# Patient Record
Sex: Male | Born: 1970 | Race: White | Hispanic: No | Marital: Single | Smoking: Never smoker
Health system: Southern US, Community
[De-identification: ages and names within clinical notes are randomized; demographics above are authoritative.]

## PROBLEM LIST (undated history)

## (undated) DIAGNOSIS — B20 Human immunodeficiency virus [HIV] disease: Secondary | ICD-10-CM

## (undated) DIAGNOSIS — N200 Calculus of kidney: Secondary | ICD-10-CM

## (undated) DIAGNOSIS — K429 Umbilical hernia without obstruction or gangrene: Secondary | ICD-10-CM

## (undated) DIAGNOSIS — Z87442 Personal history of urinary calculi: Secondary | ICD-10-CM

## (undated) DIAGNOSIS — R569 Unspecified convulsions: Secondary | ICD-10-CM

## (undated) DIAGNOSIS — K219 Gastro-esophageal reflux disease without esophagitis: Secondary | ICD-10-CM

## (undated) DIAGNOSIS — F5104 Psychophysiologic insomnia: Secondary | ICD-10-CM

## (undated) DIAGNOSIS — E8814 HIV-associated lipodystrophy: Secondary | ICD-10-CM

## (undated) DIAGNOSIS — K4020 Bilateral inguinal hernia, without obstruction or gangrene, not specified as recurrent: Secondary | ICD-10-CM

## (undated) DIAGNOSIS — E119 Type 2 diabetes mellitus without complications: Secondary | ICD-10-CM

## (undated) DIAGNOSIS — J302 Other seasonal allergic rhinitis: Secondary | ICD-10-CM

## (undated) DIAGNOSIS — T7840XA Allergy, unspecified, initial encounter: Secondary | ICD-10-CM

## (undated) HISTORY — DX: Allergy, unspecified, initial encounter: T78.40XA

## (undated) HISTORY — DX: Umbilical hernia without obstruction or gangrene: K42.9

## (undated) HISTORY — DX: Gastro-esophageal reflux disease without esophagitis: K21.9

## (undated) HISTORY — PX: WISDOM TOOTH EXTRACTION: SHX21

## (undated) HISTORY — DX: Bilateral inguinal hernia, without obstruction or gangrene, not specified as recurrent: K40.20

## (undated) HISTORY — PX: HERNIA REPAIR: SHX51

## (undated) HISTORY — DX: Human immunodeficiency virus (HIV) disease: B20

---
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
## 1996-12-17 HISTORY — PX: URETEROSCOPY: SHX842

## 1998-08-19 HISTORY — PX: LITHOTRIPSY: SUR834

## 2000-04-04 ENCOUNTER — Emergency Department (HOSPITAL_COMMUNITY): Admission: EM | Admit: 2000-04-04 | Discharge: 2000-04-04 | Payer: Self-pay | Admitting: Emergency Medicine

## 2000-08-19 DIAGNOSIS — B20 Human immunodeficiency virus [HIV] disease: Secondary | ICD-10-CM

## 2000-08-19 DIAGNOSIS — Z21 Asymptomatic human immunodeficiency virus [HIV] infection status: Secondary | ICD-10-CM

## 2000-08-19 HISTORY — DX: Asymptomatic human immunodeficiency virus (hiv) infection status: Z21

## 2000-08-19 HISTORY — DX: Human immunodeficiency virus (HIV) disease: B20

## 2000-10-02 ENCOUNTER — Encounter (INDEPENDENT_AMBULATORY_CARE_PROVIDER_SITE_OTHER): Payer: Self-pay | Admitting: *Deleted

## 2000-10-02 LAB — CONVERTED CEMR LAB: CD4 Count: 381 microliters

## 2000-11-13 ENCOUNTER — Encounter: Admission: RE | Admit: 2000-11-13 | Discharge: 2000-11-13 | Payer: Self-pay | Admitting: Infectious Diseases

## 2000-11-13 ENCOUNTER — Ambulatory Visit (HOSPITAL_COMMUNITY): Admission: RE | Admit: 2000-11-13 | Discharge: 2000-11-13 | Payer: Self-pay | Admitting: Infectious Diseases

## 2000-12-17 ENCOUNTER — Ambulatory Visit (HOSPITAL_COMMUNITY): Admission: RE | Admit: 2000-12-17 | Discharge: 2000-12-17 | Payer: Self-pay | Admitting: Infectious Diseases

## 2000-12-17 ENCOUNTER — Encounter: Admission: RE | Admit: 2000-12-17 | Discharge: 2000-12-17 | Payer: Self-pay | Admitting: Infectious Diseases

## 2000-12-17 ENCOUNTER — Encounter: Payer: Self-pay | Admitting: Infectious Diseases

## 2000-12-31 ENCOUNTER — Encounter: Admission: RE | Admit: 2000-12-31 | Discharge: 2000-12-31 | Payer: Self-pay | Admitting: Infectious Diseases

## 2001-01-27 ENCOUNTER — Encounter: Admission: RE | Admit: 2001-01-27 | Discharge: 2001-01-27 | Payer: Self-pay | Admitting: Infectious Diseases

## 2001-03-03 ENCOUNTER — Emergency Department (HOSPITAL_COMMUNITY): Admission: EM | Admit: 2001-03-03 | Discharge: 2001-03-03 | Payer: Self-pay

## 2001-03-05 ENCOUNTER — Ambulatory Visit (HOSPITAL_COMMUNITY): Admission: RE | Admit: 2001-03-05 | Discharge: 2001-03-05 | Payer: Self-pay | Admitting: Urology

## 2001-03-05 ENCOUNTER — Encounter: Payer: Self-pay | Admitting: Urology

## 2001-03-25 ENCOUNTER — Ambulatory Visit (HOSPITAL_COMMUNITY): Admission: RE | Admit: 2001-03-25 | Discharge: 2001-03-25 | Payer: Self-pay | Admitting: Infectious Diseases

## 2001-03-25 ENCOUNTER — Encounter: Admission: RE | Admit: 2001-03-25 | Discharge: 2001-03-25 | Payer: Self-pay | Admitting: Infectious Diseases

## 2001-07-22 ENCOUNTER — Encounter: Admission: RE | Admit: 2001-07-22 | Discharge: 2001-07-22 | Payer: Self-pay | Admitting: Infectious Diseases

## 2001-07-22 ENCOUNTER — Ambulatory Visit (HOSPITAL_COMMUNITY): Admission: RE | Admit: 2001-07-22 | Discharge: 2001-07-22 | Payer: Self-pay | Admitting: Infectious Diseases

## 2001-09-23 ENCOUNTER — Ambulatory Visit (HOSPITAL_COMMUNITY): Admission: RE | Admit: 2001-09-23 | Discharge: 2001-09-23 | Payer: Self-pay | Admitting: Infectious Diseases

## 2001-09-23 ENCOUNTER — Encounter: Admission: RE | Admit: 2001-09-23 | Discharge: 2001-09-23 | Payer: Self-pay | Admitting: Infectious Diseases

## 2002-01-06 ENCOUNTER — Encounter: Admission: RE | Admit: 2002-01-06 | Discharge: 2002-01-06 | Payer: Self-pay | Admitting: Infectious Diseases

## 2002-01-06 ENCOUNTER — Ambulatory Visit (HOSPITAL_COMMUNITY): Admission: RE | Admit: 2002-01-06 | Discharge: 2002-01-06 | Payer: Self-pay | Admitting: Infectious Diseases

## 2002-01-29 ENCOUNTER — Ambulatory Visit (HOSPITAL_COMMUNITY): Admission: RE | Admit: 2002-01-29 | Discharge: 2002-01-29 | Payer: Self-pay | Admitting: Infectious Diseases

## 2002-01-29 ENCOUNTER — Encounter: Admission: RE | Admit: 2002-01-29 | Discharge: 2002-01-29 | Payer: Self-pay | Admitting: Infectious Diseases

## 2002-02-01 ENCOUNTER — Ambulatory Visit (HOSPITAL_COMMUNITY): Admission: RE | Admit: 2002-02-01 | Discharge: 2002-02-01 | Payer: Self-pay | Admitting: Infectious Diseases

## 2002-02-01 ENCOUNTER — Encounter: Payer: Self-pay | Admitting: Infectious Diseases

## 2002-02-10 ENCOUNTER — Encounter: Admission: RE | Admit: 2002-02-10 | Discharge: 2002-02-10 | Payer: Self-pay | Admitting: Infectious Diseases

## 2002-02-10 ENCOUNTER — Ambulatory Visit (HOSPITAL_COMMUNITY): Admission: RE | Admit: 2002-02-10 | Discharge: 2002-02-10 | Payer: Self-pay | Admitting: Infectious Diseases

## 2002-03-24 ENCOUNTER — Encounter: Admission: RE | Admit: 2002-03-24 | Discharge: 2002-03-24 | Payer: Self-pay | Admitting: Infectious Diseases

## 2002-03-24 ENCOUNTER — Ambulatory Visit (HOSPITAL_COMMUNITY): Admission: RE | Admit: 2002-03-24 | Discharge: 2002-03-24 | Payer: Self-pay | Admitting: Infectious Diseases

## 2002-04-07 ENCOUNTER — Encounter: Admission: RE | Admit: 2002-04-07 | Discharge: 2002-04-07 | Payer: Self-pay | Admitting: Internal Medicine

## 2002-07-07 ENCOUNTER — Ambulatory Visit (HOSPITAL_COMMUNITY): Admission: RE | Admit: 2002-07-07 | Discharge: 2002-07-07 | Payer: Self-pay | Admitting: Infectious Diseases

## 2002-07-07 ENCOUNTER — Encounter: Admission: RE | Admit: 2002-07-07 | Discharge: 2002-07-07 | Payer: Self-pay | Admitting: Infectious Diseases

## 2002-07-30 ENCOUNTER — Encounter: Admission: RE | Admit: 2002-07-30 | Discharge: 2002-07-30 | Payer: Self-pay | Admitting: Infectious Diseases

## 2002-07-30 ENCOUNTER — Ambulatory Visit (HOSPITAL_COMMUNITY): Admission: RE | Admit: 2002-07-30 | Discharge: 2002-07-30 | Payer: Self-pay | Admitting: Infectious Diseases

## 2002-07-30 ENCOUNTER — Encounter: Payer: Self-pay | Admitting: Infectious Diseases

## 2002-08-30 ENCOUNTER — Ambulatory Visit (HOSPITAL_COMMUNITY): Admission: RE | Admit: 2002-08-30 | Discharge: 2002-08-30 | Payer: Self-pay | Admitting: Infectious Diseases

## 2002-08-30 ENCOUNTER — Encounter: Admission: RE | Admit: 2002-08-30 | Discharge: 2002-08-30 | Payer: Self-pay | Admitting: Infectious Diseases

## 2002-12-22 ENCOUNTER — Encounter: Admission: RE | Admit: 2002-12-22 | Discharge: 2002-12-22 | Payer: Self-pay | Admitting: Infectious Diseases

## 2002-12-22 ENCOUNTER — Encounter: Payer: Self-pay | Admitting: Infectious Diseases

## 2003-04-06 ENCOUNTER — Encounter: Admission: RE | Admit: 2003-04-06 | Discharge: 2003-04-06 | Payer: Self-pay | Admitting: Infectious Diseases

## 2003-04-06 ENCOUNTER — Encounter: Payer: Self-pay | Admitting: Infectious Diseases

## 2003-04-06 ENCOUNTER — Ambulatory Visit (HOSPITAL_COMMUNITY): Admission: RE | Admit: 2003-04-06 | Discharge: 2003-04-06 | Payer: Self-pay | Admitting: Infectious Diseases

## 2003-07-13 ENCOUNTER — Encounter: Admission: RE | Admit: 2003-07-13 | Discharge: 2003-07-13 | Payer: Self-pay | Admitting: Infectious Diseases

## 2003-07-13 ENCOUNTER — Ambulatory Visit (HOSPITAL_COMMUNITY): Admission: RE | Admit: 2003-07-13 | Discharge: 2003-07-13 | Payer: Self-pay | Admitting: Infectious Diseases

## 2003-07-13 ENCOUNTER — Encounter: Payer: Self-pay | Admitting: Infectious Diseases

## 2003-08-31 ENCOUNTER — Encounter: Admission: RE | Admit: 2003-08-31 | Discharge: 2003-08-31 | Payer: Self-pay | Admitting: Infectious Diseases

## 2003-09-14 ENCOUNTER — Encounter: Admission: RE | Admit: 2003-09-14 | Discharge: 2003-09-14 | Payer: Self-pay | Admitting: Infectious Diseases

## 2003-10-12 ENCOUNTER — Encounter: Admission: RE | Admit: 2003-10-12 | Discharge: 2003-10-12 | Payer: Self-pay | Admitting: Infectious Diseases

## 2004-01-11 ENCOUNTER — Ambulatory Visit (HOSPITAL_COMMUNITY): Admission: RE | Admit: 2004-01-11 | Discharge: 2004-01-11 | Payer: Self-pay | Admitting: Infectious Diseases

## 2004-01-11 ENCOUNTER — Encounter: Admission: RE | Admit: 2004-01-11 | Discharge: 2004-01-11 | Payer: Self-pay | Admitting: Infectious Diseases

## 2004-05-23 ENCOUNTER — Ambulatory Visit: Payer: Self-pay | Admitting: Infectious Diseases

## 2004-05-23 ENCOUNTER — Ambulatory Visit (HOSPITAL_COMMUNITY): Admission: RE | Admit: 2004-05-23 | Discharge: 2004-05-23 | Payer: Self-pay | Admitting: Infectious Diseases

## 2004-09-28 ENCOUNTER — Ambulatory Visit: Payer: Self-pay | Admitting: Infectious Diseases

## 2004-10-01 ENCOUNTER — Ambulatory Visit (HOSPITAL_COMMUNITY): Admission: RE | Admit: 2004-10-01 | Discharge: 2004-10-01 | Payer: Self-pay | Admitting: Infectious Diseases

## 2004-10-01 ENCOUNTER — Ambulatory Visit: Payer: Self-pay | Admitting: Infectious Diseases

## 2004-12-28 ENCOUNTER — Ambulatory Visit: Payer: Self-pay | Admitting: Infectious Diseases

## 2005-05-31 ENCOUNTER — Ambulatory Visit: Payer: Self-pay | Admitting: Infectious Diseases

## 2005-05-31 ENCOUNTER — Ambulatory Visit (HOSPITAL_COMMUNITY): Admission: RE | Admit: 2005-05-31 | Discharge: 2005-05-31 | Payer: Self-pay | Admitting: Infectious Diseases

## 2006-01-22 ENCOUNTER — Ambulatory Visit: Payer: Self-pay | Admitting: Infectious Diseases

## 2006-01-22 ENCOUNTER — Encounter (INDEPENDENT_AMBULATORY_CARE_PROVIDER_SITE_OTHER): Payer: Self-pay | Admitting: *Deleted

## 2006-01-22 ENCOUNTER — Encounter: Admission: RE | Admit: 2006-01-22 | Discharge: 2006-01-22 | Payer: Self-pay | Admitting: Infectious Diseases

## 2006-05-26 ENCOUNTER — Ambulatory Visit: Payer: Self-pay | Admitting: Infectious Diseases

## 2006-05-26 ENCOUNTER — Encounter: Admission: RE | Admit: 2006-05-26 | Discharge: 2006-05-26 | Payer: Self-pay | Admitting: Infectious Diseases

## 2006-05-26 ENCOUNTER — Encounter (INDEPENDENT_AMBULATORY_CARE_PROVIDER_SITE_OTHER): Payer: Self-pay | Admitting: *Deleted

## 2006-05-26 LAB — CONVERTED CEMR LAB: HIV 1 RNA Quant: 424 copies/mL

## 2006-05-29 DIAGNOSIS — B20 Human immunodeficiency virus [HIV] disease: Secondary | ICD-10-CM

## 2006-05-29 DIAGNOSIS — A630 Anogenital (venereal) warts: Secondary | ICD-10-CM | POA: Insufficient documentation

## 2006-05-29 DIAGNOSIS — J309 Allergic rhinitis, unspecified: Secondary | ICD-10-CM | POA: Insufficient documentation

## 2006-05-29 DIAGNOSIS — B029 Zoster without complications: Secondary | ICD-10-CM | POA: Insufficient documentation

## 2006-05-29 DIAGNOSIS — B259 Cytomegaloviral disease, unspecified: Secondary | ICD-10-CM

## 2006-05-29 DIAGNOSIS — E781 Pure hyperglyceridemia: Secondary | ICD-10-CM

## 2006-08-27 ENCOUNTER — Encounter: Admission: RE | Admit: 2006-08-27 | Discharge: 2006-08-27 | Payer: Self-pay | Admitting: Infectious Diseases

## 2006-08-27 ENCOUNTER — Ambulatory Visit: Payer: Self-pay | Admitting: Infectious Diseases

## 2006-08-27 ENCOUNTER — Encounter (INDEPENDENT_AMBULATORY_CARE_PROVIDER_SITE_OTHER): Payer: Self-pay | Admitting: *Deleted

## 2006-08-27 LAB — CONVERTED CEMR LAB
CD4 Count: 510 microliters
HIV 1 RNA Quant: 49 copies/mL

## 2006-08-28 ENCOUNTER — Encounter: Payer: Self-pay | Admitting: Infectious Diseases

## 2006-08-28 LAB — CONVERTED CEMR LAB
ALT: 115 units/L — ABNORMAL HIGH (ref 0–53)
AST: 73 units/L — ABNORMAL HIGH (ref 0–37)
Albumin: 4.5 g/dL (ref 3.5–5.2)
Alkaline Phosphatase: 80 units/L (ref 39–117)
BUN: 13 mg/dL (ref 6–23)
CO2: 25 meq/L (ref 19–32)
Calcium: 9.4 mg/dL (ref 8.4–10.5)
Chloride: 103 meq/L (ref 96–112)
Cholesterol: 187 mg/dL (ref 0–200)
Creatinine, Ser: 0.75 mg/dL (ref 0.40–1.50)
Glucose, Bld: 95 mg/dL (ref 70–99)
HCT: 42.6 % (ref 39.0–52.0)
HDL: 31 mg/dL — ABNORMAL LOW (ref >39)
Hemoglobin: 14.6 g/dL (ref 13.0–17.0)
LDL Cholesterol: 91 mg/dL (ref 0–99)
MCHC: 34.3 g/dL (ref 30.0–36.0)
MCV: 125.7 fL — ABNORMAL HIGH (ref 78.0–100.0)
Platelets: 344 10*3/uL (ref 150–400)
Potassium: 4.3 meq/L (ref 3.5–5.3)
RBC: 3.39 M/uL — ABNORMAL LOW (ref 4.22–5.81)
RDW: 13.7 % (ref 11.5–14.0)
Sodium: 140 meq/L (ref 135–145)
Total Bilirubin: 0.9 mg/dL (ref 0.3–1.2)
Total CHOL/HDL Ratio: 6
Total Protein: 7.5 g/dL (ref 6.0–8.3)
Triglycerides: 326 mg/dL — ABNORMAL HIGH (ref <150)
VLDL: 65 mg/dL — ABNORMAL HIGH (ref 0–40)
WBC: 4.7 10*3/uL (ref 4.0–10.5)

## 2006-10-13 ENCOUNTER — Encounter (INDEPENDENT_AMBULATORY_CARE_PROVIDER_SITE_OTHER): Payer: Self-pay | Admitting: *Deleted

## 2006-10-13 LAB — CONVERTED CEMR LAB

## 2006-10-26 ENCOUNTER — Encounter (INDEPENDENT_AMBULATORY_CARE_PROVIDER_SITE_OTHER): Payer: Self-pay | Admitting: *Deleted

## 2006-11-04 ENCOUNTER — Encounter: Payer: Self-pay | Admitting: Infectious Diseases

## 2006-12-08 ENCOUNTER — Telehealth: Payer: Self-pay | Admitting: Infectious Diseases

## 2007-01-19 ENCOUNTER — Telehealth: Payer: Self-pay | Admitting: Infectious Diseases

## 2007-01-30 ENCOUNTER — Telehealth: Payer: Self-pay | Admitting: Infectious Diseases

## 2007-02-25 ENCOUNTER — Encounter: Admission: RE | Admit: 2007-02-25 | Discharge: 2007-02-25 | Payer: Self-pay | Admitting: Infectious Diseases

## 2007-02-25 ENCOUNTER — Ambulatory Visit: Payer: Self-pay | Admitting: Infectious Diseases

## 2007-02-25 LAB — CONVERTED CEMR LAB
Alkaline Phosphatase: 90 units/L (ref 39–117)
BUN: 14 mg/dL (ref 6–23)
Creatinine, Ser: 0.73 mg/dL (ref 0.40–1.50)
Glucose, Bld: 87 mg/dL (ref 70–99)
HDL: 36 mg/dL — ABNORMAL LOW (ref >39)
Hemoglobin: 15.4 g/dL (ref 13.0–17.0)
LDL Cholesterol: 91 mg/dL (ref 0–99)
MCHC: 33.8 g/dL (ref 30.0–36.0)
MCV: 124 fL — ABNORMAL HIGH (ref 78.0–100.0)
RBC: 3.67 M/uL — ABNORMAL LOW (ref 4.22–5.81)
Sodium: 140 meq/L (ref 135–145)
Total Bilirubin: 0.8 mg/dL (ref 0.3–1.2)
Total CHOL/HDL Ratio: 4.6
Triglycerides: 197 mg/dL — ABNORMAL HIGH (ref <150)
VLDL: 39 mg/dL (ref 0–40)

## 2007-02-26 ENCOUNTER — Encounter: Payer: Self-pay | Admitting: Infectious Diseases

## 2007-05-22 ENCOUNTER — Telehealth: Payer: Self-pay | Admitting: Infectious Diseases

## 2007-06-24 ENCOUNTER — Ambulatory Visit: Payer: Self-pay | Admitting: Infectious Diseases

## 2007-06-24 ENCOUNTER — Encounter: Admission: RE | Admit: 2007-06-24 | Discharge: 2007-06-24 | Payer: Self-pay | Admitting: Infectious Diseases

## 2007-06-24 LAB — CONVERTED CEMR LAB
Albumin: 4.5 g/dL (ref 3.5–5.2)
Alkaline Phosphatase: 102 units/L (ref 39–117)
BUN: 13 mg/dL (ref 6–23)
CO2: 27 meq/L (ref 19–32)
Glucose, Bld: 80 mg/dL (ref 70–99)
HIV 1 RNA Quant: 237 copies/mL — ABNORMAL HIGH (ref <50)
HIV-1 RNA Quant, Log: 2.37 — ABNORMAL HIGH (ref <1.70)
Hemoglobin: 14.3 g/dL (ref 13.0–17.0)
MCHC: 34.1 g/dL (ref 30.0–36.0)
MCV: 124 fL — ABNORMAL HIGH (ref 78.0–100.0)
RBC: 3.38 M/uL — ABNORMAL LOW (ref 4.22–5.81)
Total Bilirubin: 0.6 mg/dL (ref 0.3–1.2)

## 2007-07-03 ENCOUNTER — Ambulatory Visit: Payer: Self-pay | Admitting: Infectious Diseases

## 2007-07-03 DIAGNOSIS — R21 Rash and other nonspecific skin eruption: Secondary | ICD-10-CM | POA: Insufficient documentation

## 2007-07-03 DIAGNOSIS — N200 Calculus of kidney: Secondary | ICD-10-CM | POA: Insufficient documentation

## 2007-07-04 ENCOUNTER — Encounter: Payer: Self-pay | Admitting: Infectious Diseases

## 2007-07-14 ENCOUNTER — Ambulatory Visit: Payer: Self-pay | Admitting: Infectious Diseases

## 2007-11-25 ENCOUNTER — Ambulatory Visit: Payer: Self-pay | Admitting: Infectious Diseases

## 2007-11-25 DIAGNOSIS — H35719 Central serous chorioretinopathy, unspecified eye: Secondary | ICD-10-CM

## 2007-12-17 ENCOUNTER — Encounter: Admission: RE | Admit: 2007-12-17 | Discharge: 2007-12-17 | Payer: Self-pay | Admitting: Infectious Diseases

## 2007-12-17 ENCOUNTER — Ambulatory Visit: Payer: Self-pay | Admitting: Infectious Diseases

## 2007-12-17 LAB — CONVERTED CEMR LAB
ALT: 22 units/L (ref 0–53)
AST: 20 units/L (ref 0–37)
Albumin: 4.3 g/dL (ref 3.5–5.2)
Alkaline Phosphatase: 70 units/L (ref 39–117)
Glucose, Bld: 87 mg/dL (ref 70–99)
HDL: 35 mg/dL — ABNORMAL LOW (ref >39)
MCHC: 34.7 g/dL (ref 30.0–36.0)
Platelets: 298 10*3/uL (ref 150–400)
Potassium: 4.4 meq/L (ref 3.5–5.3)
RBC: 3.2 M/uL — ABNORMAL LOW (ref 4.22–5.81)
Sodium: 143 meq/L (ref 135–145)
Total CHOL/HDL Ratio: 3.4
Total Protein: 6.6 g/dL (ref 6.0–8.3)
Triglycerides: 98 mg/dL (ref <150)

## 2007-12-28 ENCOUNTER — Telehealth: Payer: Self-pay | Admitting: Infectious Diseases

## 2008-04-27 ENCOUNTER — Ambulatory Visit: Payer: Self-pay | Admitting: Infectious Diseases

## 2008-04-27 LAB — CONVERTED CEMR LAB
HDL: 36 mg/dL — ABNORMAL LOW (ref >39)
LDL Cholesterol: 76 mg/dL (ref 0–99)
Total CHOL/HDL Ratio: 4.2
Triglycerides: 194 mg/dL — ABNORMAL HIGH (ref <150)
VLDL: 39 mg/dL (ref 0–40)

## 2008-06-27 ENCOUNTER — Ambulatory Visit: Payer: Self-pay | Admitting: *Deleted

## 2008-09-26 ENCOUNTER — Ambulatory Visit: Payer: Self-pay | Admitting: Infectious Diseases

## 2008-09-26 LAB — CONVERTED CEMR LAB
AST: 25 units/L (ref 0–37)
Albumin: 4.8 g/dL (ref 3.5–5.2)
Alkaline Phosphatase: 66 units/L (ref 39–117)
BUN: 14 mg/dL (ref 6–23)
Basophils Relative: 1 % (ref 0–1)
Eosinophils Absolute: 0.1 10*3/uL (ref 0.0–0.7)
HIV-1 RNA Quant, Log: 2.71 — ABNORMAL HIGH (ref <1.68)
MCHC: 33.4 g/dL (ref 30.0–36.0)
MCV: 120.2 fL — ABNORMAL HIGH (ref 78.0–100.0)
Monocytes Relative: 9 % (ref 3–12)
Neutrophils Relative %: 53 % (ref 43–77)
Platelets: 222 10*3/uL (ref 150–400)
Potassium: 4.3 meq/L (ref 3.5–5.3)
RDW: 13.7 % (ref 11.5–15.5)
Sodium: 142 meq/L (ref 135–145)

## 2009-01-24 ENCOUNTER — Telehealth: Payer: Self-pay | Admitting: Infectious Diseases

## 2009-02-22 ENCOUNTER — Ambulatory Visit: Payer: Self-pay | Admitting: Infectious Diseases

## 2009-02-22 LAB — CONVERTED CEMR LAB
ALT: 24 units/L (ref 0–53)
Albumin: 4.6 g/dL (ref 3.5–5.2)
CO2: 26 meq/L (ref 19–32)
Calcium: 9.6 mg/dL (ref 8.4–10.5)
Chloride: 106 meq/L (ref 96–112)
Creatinine, Ser: 0.68 mg/dL (ref 0.40–1.50)
HDL: 34 mg/dL — ABNORMAL LOW (ref >39)
Lymphocytes Relative: 28 % (ref 12–46)
Lymphs Abs: 2.1 10*3/uL (ref 0.7–4.0)
Neutro Abs: 4.1 10*3/uL (ref 1.7–7.7)
Neutrophils Relative %: 56 % (ref 43–77)
Platelets: 255 10*3/uL (ref 150–400)
Total Protein: 7.5 g/dL (ref 6.0–8.3)
Triglycerides: 262 mg/dL — ABNORMAL HIGH (ref <150)
WBC: 7.3 10*3/uL (ref 4.0–10.5)

## 2009-06-22 ENCOUNTER — Encounter: Payer: Self-pay | Admitting: Infectious Diseases

## 2009-06-22 DIAGNOSIS — A6000 Herpesviral infection of urogenital system, unspecified: Secondary | ICD-10-CM | POA: Insufficient documentation

## 2009-06-23 ENCOUNTER — Encounter: Payer: Self-pay | Admitting: Infectious Diseases

## 2009-07-05 ENCOUNTER — Ambulatory Visit: Payer: Self-pay | Admitting: Infectious Diseases

## 2009-07-05 LAB — CONVERTED CEMR LAB
ALT: 30 units/L (ref 0–53)
AST: 30 units/L (ref 0–37)
Albumin: 4.5 g/dL (ref 3.5–5.2)
CO2: 26 meq/L (ref 19–32)
Calcium: 9 mg/dL (ref 8.4–10.5)
Chloride: 105 meq/L (ref 96–112)
Creatinine, Ser: 0.72 mg/dL (ref 0.40–1.50)
Eosinophils Absolute: 0.1 10*3/uL (ref 0.0–0.7)
Eosinophils Relative: 1 % (ref 0–5)
HCT: 34.4 % — ABNORMAL LOW (ref 39.0–52.0)
HIV 1 RNA Quant: <48 copies/mL (ref <48)
Herpes Simplex Vrs I&II-IgM Ab (EIA): 4.2 — ABNORMAL HIGH
Lymphocytes Relative: 30 % (ref 12–46)
Lymphs Abs: 2.1 10*3/uL (ref 0.7–4.0)
MCV: 123.7 fL — ABNORMAL HIGH (ref >=78.0)
Monocytes Relative: 10 % (ref 3–12)
Neutrophils Relative %: 60 % (ref 43–77)
Potassium: 4 meq/L (ref 3.5–5.3)
RBC: 2.78 M/uL — ABNORMAL LOW (ref 4.22–5.81)
WBC: 7.2 10*3/uL (ref 4.0–10.5)

## 2009-08-23 ENCOUNTER — Telehealth: Payer: Self-pay | Admitting: Infectious Diseases

## 2009-08-28 ENCOUNTER — Telehealth: Payer: Self-pay | Admitting: Infectious Diseases

## 2009-09-20 ENCOUNTER — Encounter (INDEPENDENT_AMBULATORY_CARE_PROVIDER_SITE_OTHER): Payer: Self-pay | Admitting: *Deleted

## 2009-10-24 ENCOUNTER — Ambulatory Visit: Payer: Self-pay | Admitting: Infectious Diseases

## 2009-10-24 LAB — CONVERTED CEMR LAB
Alkaline Phosphatase: 79 units/L (ref 39–117)
BUN: 19 mg/dL (ref 6–23)
Basophils Absolute: 0 10*3/uL (ref 0.0–0.1)
Basophils Relative: 1 % (ref 0–1)
CO2: 26 meq/L (ref 19–32)
Cholesterol: 180 mg/dL (ref 0–200)
Eosinophils Absolute: 0.2 10*3/uL (ref 0.0–0.7)
Eosinophils Relative: 4 % (ref 0–5)
Glucose, Bld: 100 mg/dL — ABNORMAL HIGH (ref 70–99)
HCT: 35.6 % — ABNORMAL LOW (ref 39.0–52.0)
HDL: 34 mg/dL — ABNORMAL LOW (ref >39)
HIV-1 RNA Quant, Log: <1.68 (ref <1.68)
Hemoglobin: 12.2 g/dL — ABNORMAL LOW (ref 13.0–17.0)
MCHC: 34.3 g/dL (ref 30.0–36.0)
MCV: 127.6 fL — ABNORMAL HIGH (ref >=78.0)
Monocytes Absolute: 0.5 10*3/uL (ref 0.1–1.0)
Monocytes Relative: 12 % (ref 3–12)
RBC: 2.79 M/uL — ABNORMAL LOW (ref 4.22–5.81)
RDW: 13.8 % (ref 11.5–15.5)
Sodium: 142 meq/L (ref 135–145)
Total Bilirubin: 0.5 mg/dL (ref 0.3–1.2)
Total Protein: 7.2 g/dL (ref 6.0–8.3)
Triglycerides: 560 mg/dL — ABNORMAL HIGH (ref <150)

## 2010-05-30 ENCOUNTER — Ambulatory Visit: Payer: Self-pay | Admitting: Infectious Diseases

## 2010-05-30 LAB — CONVERTED CEMR LAB
Alkaline Phosphatase: 73 units/L (ref 39–117)
CO2: 24 meq/L (ref 19–32)
Creatinine, Ser: 0.76 mg/dL (ref 0.40–1.50)
Eosinophils Absolute: 0.1 10*3/uL (ref 0.0–0.7)
Eosinophils Relative: 2 % (ref 0–5)
Glucose, Bld: 88 mg/dL (ref 70–99)
HCT: 33.8 % — ABNORMAL LOW (ref 39.0–52.0)
HIV 1 RNA Quant: <20 copies/mL (ref <20)
HIV-1 RNA Quant, Log: <1.3 (ref <1.30)
Hemoglobin: 12.4 g/dL — ABNORMAL LOW (ref 13.0–17.0)
Lymphocytes Relative: 22 % (ref 12–46)
Lymphs Abs: 1.7 10*3/uL (ref 0.7–4.0)
MCV: 123.8 fL — ABNORMAL HIGH (ref 78.0–100.0)
Monocytes Absolute: 0.7 10*3/uL (ref 0.1–1.0)
Platelets: 247 10*3/uL (ref 150–400)
RDW: 12.9 % (ref 11.5–15.5)
Total Bilirubin: 0.8 mg/dL (ref 0.3–1.2)
WBC: 7.6 10*3/uL (ref 4.0–10.5)

## 2010-07-03 ENCOUNTER — Telehealth: Payer: Self-pay | Admitting: Infectious Diseases

## 2010-08-01 ENCOUNTER — Encounter: Payer: Self-pay | Admitting: Infectious Diseases

## 2010-08-01 ENCOUNTER — Encounter (INDEPENDENT_AMBULATORY_CARE_PROVIDER_SITE_OTHER): Payer: Self-pay | Admitting: *Deleted

## 2010-09-16 LAB — CONVERTED CEMR LAB
HIV 1 RNA Quant: <50 copies/mL (ref <50)
HIV-1 RNA Quant, Log: <1.7 (ref <1.70)

## 2010-09-18 NOTE — Assessment & Plan Note (Signed)
Summary: f/u per pt. will do on same day [mkj]   CC:  follow-up visit.  History of Present Illness: 40 yo M with HIV+ since 2002. Maintained on CBV/EFV. CD4 700 VL <48  (11-13-09). Preparing for vacation.   Preventive Screening-Counseling & Management  Alcohol-Tobacco     Alcohol drinks/day: 2 drinks a week     Alcohol type: mixed drinks     Smoking Status: never     Passive Smoke Exposure: no  Caffeine-Diet-Exercise     Caffeine use/day: coffee     Does Patient Exercise: yes     Type of exercise: active at work     Exercise (avg: min/session): >60     Times/week: 5  Safety-Violence-Falls     Seat Belt Use: yes   Updated Prior Medication List: COMBIVIR 150-300 MG TABS (LAMIVUDINE-ZIDOVUDINE) one twice a day SUSTIVA 600 MG TABS (EFAVIRENZ) one at HS OMEPRAZOLE 20 MG  TBEC (OMEPRAZOLE) Take 1 tablet by mouth at bedtime, three times weekly PEPCID 40 MG  TABS (FAMOTIDINE) every morning ZYRTEC ALLERGY 10 MG  TABS (CETIRIZINE HCL) Take 1 tablet by mouth at bedtime MOBIC 15 MG TABS (MELOXICAM) take 1/2 to 1 tablet by mouth daily as needed BABY ASPIRIN 81 MG CHEW (ASPIRIN) Take 1 tablet by mouth once a day with food  Current Allergies (reviewed today): ! AMPICILLIN Past History:  Past medical, surgical, family and social histories (including risk factors) reviewed, and no changes noted (except as noted below).  Past Medical History: Reviewed history from 10/24/2009 and no changes required. HIV disease Liposdystrophy  Family History: Reviewed history from 06/24/2007 and no changes required. CAD in fathers family (60-70s).   Social History: Reviewed history from 05/29/2006 and no changes required. Single Never Smoked Drug use-no  Vital Signs:  Patient profile:   40 year old male Height:      72 inches (182.88 cm) Weight:      159.8 pounds (72.64 kg) BMI:     21.75 Temp:     98.5 degrees F (36.94 degrees C) oral Pulse rate:   106 / minute BP sitting:   127 / 83   (right arm)  Vitals Entered By: Baxter Hire) (May 30, 2010 9:10 AM) CC: follow-up visit Pain Assessment Patient in pain? no      Nutritional Status BMI of 19 -24 = normal Nutritional Status Detail appetite is good per patient  Have you ever been in a relationship where you felt threatened, hurt or afraid?No   Does patient need assistance? Functional Status Self care Ambulation Normal   Physical Exam  General:  well-developed, well-nourished, and well-hydrated.   Eyes:  pupils equal, pupils round, and pupils reactive to light.   Mouth:  pharynx pink and moist and no exudates.   Neck:  no masses.   Lungs:  normal respiratory effort and normal breath sounds.   Heart:  normal rate, regular rhythm, and no murmur.   Abdomen:  soft, non-tender, and normal bowel sounds.          Medication Adherence: 05/30/2010   Adherence to medications reviewed with patient. Counseling to provide adequate adherence provided   Prevention For Positives: 05/30/2010   Safe sex practices discussed with patient. Condoms offered.                             Impression & Recommendations:  Problem # 1:  HIV DISEASE (ICD-042) doing well, will recheck his labs. update  his vax (got Tet last month), got flu shot today. offered condoms. return to clinic 6 months.   Medications Added to Medication List This Visit: 1)  Omeprazole 20 Mg Tbec (Omeprazole) .... Take 1 tablet by mouth at bedtime, three times weekly  Other Orders: Influenza Vaccine NON MCR [id number]) T-CD4SP [facility]) (CD4SP) T-HIV Viral Load [phone]) T-Comprehensive Metabolic Panel [phone]) T-CBC w/Diff ([id number]) Est. Patient Level III ([address])    Influenza Vaccine    Vaccine Type: Fluvax Non-MCR    Site: right deltoid    Mfr: Novartis    Dose: 0.5 ml    Route: IM    Given by: Kathi Simpers CMA(AAMA)    Exp. Date: 11/18/2010    Lot #: [id number]    VIS given: 03/13/10 version given May 30, 2010.  Flu  Vaccine Consent Questions    Do you have a history of severe allergic reactions to this vaccine? no    Any prior history of allergic reactions to egg and/or gelatin? no    Do you have a sensitivity to the preservative Thimersol? no    Do you have a past history of Guillan-Barre Syndrome? no    Do you currently have an acute febrile illness? no    Have you ever had a severe reaction to latex? no    Vaccine information given and explained to patient? yes

## 2010-09-18 NOTE — Progress Notes (Signed)
  Phone Note Call from Patient   Reason for Call: Acute Illness, Talk to Doctor Summary of Call: pt believes he has sinus infection, would like z-pack and his flonase refilled.    New/Updated Medications: ZITHROMAX Z-PAK 250 MG TABS (AZITHROMYCIN) 2 tabs on day 1, then 1 tab each day on days 2-5 Prescriptions: ZITHROMAX Z-PAK 250 MG TABS (AZITHROMYCIN) 2 tabs on day 1, then 1 tab each day on days 2-5  #1 x 1   Entered and Authorized by:   Johny Sax MD   Signed by:   Johny Sax MD on 07/03/2010   Method used:   [facility]        [facility] [address] [phone]* (retail)       [address]       [place], [place]  [address]       Ph: [phone] or [phone]       Fax: [phone]   RxID:   [id number] FLUTICASONE PROPIONATE 50 MCG/ACT SUSP (FLUTICASONE PROPIONATE) Instill two sprays  nasally two times a day as needed for nasal symptoms  #16 [place] x 5   Entered and Authorized by:   Johny Sax MD   Signed by:   Johny Sax MD on 07/03/2010   Method used:   [facility]        [facility] [address] [phone]* (retail)       [address]       [place], [place]  [address]       Ph: [phone] or [phone]       Fax: [phone]   RxID:   [id number]

## 2010-09-18 NOTE — Miscellaneous (Signed)
Summary: RW Update  Clinical Lists Changes  Observations: Added new observation of HIV STATUS: CDC-defined AIDS (09/20/2009 16:30)

## 2010-09-18 NOTE — Assessment & Plan Note (Signed)
Summary: Brendan Price   CC:  follow-up visit.  History of Present Illness: 40 yo M with HIV+ since 2002. Maintained on CBV/EFV. CD4 700 VL <48  (11-10).  worried about FHx CAD, wants to start baby ASA.  otherwise doing well. no further rectal lesions.   Preventive Screening-Counseling & Management  Alcohol-Tobacco     Alcohol drinks/day: 2 drinks a week     Alcohol type: mixed drinks     Smoking Status: never     Passive Smoke Exposure: no  Caffeine-Diet-Exercise     Caffeine use/day: coffee     Does Patient Exercise: yes     Type of exercise: active at work     Exercise (avg: min/session): >60     Times/week: 5  Hep-HIV-STD-Contraception     HIV Risk: risk noted     HIV Risk Counseling: to avoid increased HIV risk  Safety-Violence-Falls     Seat Belt Use: yes  Comments: pt declined condoms  Current Medications (verified): 1)  Combivir 150-300 Mg Tabs (Lamivudine-Zidovudine) .... One Twice A Day 2)  Sustiva 600 Mg Tabs (Efavirenz) .... One At [facility] 3)  Omeprazole 20 Mg  Tbec (Omeprazole) .... Take 1 Tablet By Mouth At Bedtime 4)  Pepcid 40 Mg  Tabs (Famotidine) .... Every Morning 5)  Zyrtec Allergy 10 Mg  Tabs (Cetirizine Hcl) .... Take 1 Tablet By Mouth At Bedtime 6)  Mobic 15 Mg Tabs (Meloxicam) .... Take 1/2 To 1 Tablet By Mouth Daily As Needed  Allergies: 1)  ! Ampicillin    Updated Prior Medication List: COMBIVIR 150-300 MG TABS (LAMIVUDINE-ZIDOVUDINE) one twice a day SUSTIVA 600 MG TABS (EFAVIRENZ) one at HS OMEPRAZOLE 20 MG  TBEC (OMEPRAZOLE) Take 1 tablet by mouth at bedtime PEPCID 40 MG  TABS (FAMOTIDINE) every morning ZYRTEC ALLERGY 10 MG  TABS (CETIRIZINE HCL) Take 1 tablet by mouth at bedtime MOBIC 15 MG TABS (MELOXICAM) take 1/2 to 1 tablet by mouth daily as needed  Current Allergies (reviewed today): ! AMPICILLIN Past History:  Past Medical History: HIV disease Liposdystrophy  Review of Systems       wt up 7#  Vital Signs:  Patient profile:    40 year old male Height:      72 inches (182.88 cm) Weight:      158.7 pounds (68.86 kg) BMI:     21.60 Temp:     98.0 degrees F (36.67 degrees C) oral Pulse rate:   105 / minute BP sitting:   126 / 74  (left arm)  Vitals Entered By: Wendall Mola CMA Duncan Dull) (October 24, 2009 9:23 AM) CC: follow-up visit Is Patient Diabetic? No Pain Assessment Patient in pain? no      Nutritional Status BMI of 19 -24 = normal Nutritional Status Detail appetite "good"  Does patient need assistance? Functional Status Self care Ambulation Normal Comments no missed doses of meds per patient        Medication Adherence: 10/24/2009   Adherence to medications reviewed with patient. Counseling to provide adequate adherence provided   Prevention For Positives: 10/24/2009   Safe sex practices discussed with patient. Condoms offered.                             Physical Exam  General:  well-developed, well-nourished, and well-hydrated.   Eyes:  pupils equal, pupils round, and pupils reactive to light.   Mouth:  pharynx pink and moist and no  exudates.   Neck:  no masses.   Lungs:  normal respiratory effort and normal breath sounds.   Heart:  normal rate, regular rhythm, and no murmur.   Abdomen:  soft, non-tender, and normal bowel sounds.     Impression & Recommendations:  Problem # 1:  HIV DISEASE (ICD-042) doimg well. no change in meds. offered condoms. return to clinic 6 months  Problem # 2:  GENITAL HERPES (ICD-054.10) no further lesions, will cont to watch.   Other Orders: Est. Patient Level IV [id number]) T-CD4SP ([facility]) (CD4SP) T-HIV Viral Load [phone]) T-Comprehensive Metabolic Panel [phone]) T-Lipid Profile [phone]) T-CBC w/Diff [phone]) T-RPR (Syphilis) [phone])  Process Orders Check Orders Results:     [facility]: ABN not required for this insurance Tests Sent for requisitioning (October 24, 2009 9:56 AM):      10/24/2009: [facility] -- T-HIV Viral Load [phone] (signed)     10/24/2009: [facility] -- T-Comprehensive Metabolic Panel [[id number]] (signed)     10/24/2009: [facility] -- T-Lipid Profile [phone] (signed)     10/24/2009: [facility] -- T-CBC w/Diff [[id number]] (signed)     10/24/2009: [facility] -- T-RPR (Syphilis) [phone] (signed)    Immunization History:  Influenza Immunization History:    Influenza:  historical (06/19/2009)

## 2010-09-18 NOTE — Progress Notes (Signed)
Summary: Request to restart Meloxicam  Phone Note From Pharmacy   Caller: fax from [facility] Reason for Call: Needs renewal Summary of Call: Received a fax refill request for patient's Meloxicam 15mg   1/2 tablet as needed #30  apparently filled by Lavada Mesi MD at one time.  Medication was stopped by Ninetta Lights on 04/27/08. Patient is asking to restart this medication.  Do you want to restart or does patient need to be seen by you or another physician?  Follow-up for Phone Call        ok to restart thanks     Appended Document: Request to restart Meloxicam    Clinical Lists Changes  Medications: Added new medication of MELOXICAM 15 MG TABS (MELOXICAM) Take 1/2 tablet by mouth daily as needed - Signed Rx of MELOXICAM 15 MG TABS (MELOXICAM) Take 1/2 tablet by mouth daily as needed;  #30 x 5;  Signed;  Entered by: Paulo Fruit  BS,CPht II,MPH;  Authorized by: Johny Sax MD;  Method used: [facility]phoned [facility] [phone]*, [address] [place], [place]  [address], Ph: [phone] or [phone], Fax: [phone]    Prescriptions: MELOXICAM 15 MG TABS (MELOXICAM) Take 1/2 tablet by mouth daily as needed  #30 x 5   Entered by:   Paulo Fruit  BS,CPht II,MPH   Authorized by:   Johny Sax MD   Signed by:   Paulo Fruit  BS,CPht II,MPH on 08/23/2009   Method used:   Telephoned to ...       [facility] [address] [phone]* (retail)       [address]       [place], [place]  [address]       Ph: [phone] or [phone]       Fax: [phone]   RxID:   [id number]  Paulo Fruit  BS,CPht II,MPH  August 23, 2009 3:06 PM

## 2010-09-18 NOTE — Progress Notes (Signed)
Summary: Request change in directions per patient  Phone Note From Pharmacy   [facility] Summary of Call: Received a phone call back from [place] at [facility].  He said that the patient stated that his Meloxicam 15mg  1/ 2 to 1 tablet daily.  Do you want to change directions and if so, they are requesting 90-day supply per insurance.  Please let me know. Initial call taken by: Paulo Fruit  BS,CPht II,MPH,  August 28, 2009 8:53 AM  Follow-up for Phone Call        According to the previous phone note dated 08-23-08 this would be correct.  It is ok to refill as requested by patient.  Follow-up by: Tomasita Morrow RN,  August 28, 2009 9:29 AM  Additional Follow-up for Phone Call Additional follow up Details #1::        Medication directions changed to take one -half to one daily as needed with # 90 and 3 RF. Called to [facility] today by Tomasita Morrow RN  August 30, 2009 11:50 AM  Additional Follow-up by: Tomasita Morrow RN,  August 30, 2009 11:50 AM    New/Updated Medications: MOBIC 15 MG TABS (MELOXICAM) take 1/2 to 1 tablet by mouth daily as needed Prescriptions: MOBIC 15 MG TABS (MELOXICAM) take 1/2 to 1 tablet by mouth daily as needed  #90 x 3   Entered by:   Tomasita Morrow RN   Authorized by:   Johny Sax MD   Signed by:   Tomasita Morrow RN on 08/30/2009   Method used:   Telephoned to ...       [facility] [address] [phone]* (retail)       [address]       [place], [place]  [address]       Ph: [phone] or [phone]       Fax: [phone]   RxID:   [id number]

## 2010-09-20 NOTE — Miscellaneous (Signed)
  Clinical Lists Changes  Observations: Added new observation of YEARAIDSPOS: 2006  (08/01/2010 11:23)

## 2010-10-31 LAB — T-HELPER CELL (CD4) - (RCID CLINIC ONLY)
CD4 % Helper T Cell: 36 % (ref 33–55)
CD4 T Cell Abs: 570 uL (ref 400–2700)

## 2010-11-09 LAB — T-HELPER CELL (CD4) - (RCID CLINIC ONLY)
CD4 % Helper T Cell: 39 % (ref 33–55)
CD4 T Cell Abs: 700 uL (ref 400–2700)

## 2010-12-03 ENCOUNTER — Ambulatory Visit: Payer: Self-pay | Admitting: Infectious Diseases

## 2010-12-05 ENCOUNTER — Other Ambulatory Visit: Payer: Self-pay

## 2010-12-07 ENCOUNTER — Ambulatory Visit (INDEPENDENT_AMBULATORY_CARE_PROVIDER_SITE_OTHER): Payer: BC Managed Care – PPO | Admitting: Infectious Diseases

## 2010-12-07 ENCOUNTER — Encounter: Payer: Self-pay | Admitting: Infectious Diseases

## 2010-12-07 DIAGNOSIS — J309 Allergic rhinitis, unspecified: Secondary | ICD-10-CM | POA: Insufficient documentation

## 2010-12-07 DIAGNOSIS — F419 Anxiety disorder, unspecified: Secondary | ICD-10-CM

## 2010-12-07 DIAGNOSIS — Z23 Encounter for immunization: Secondary | ICD-10-CM

## 2010-12-07 DIAGNOSIS — F411 Generalized anxiety disorder: Secondary | ICD-10-CM

## 2010-12-07 DIAGNOSIS — E781 Pure hyperglyceridemia: Secondary | ICD-10-CM

## 2010-12-07 DIAGNOSIS — K219 Gastro-esophageal reflux disease without esophagitis: Secondary | ICD-10-CM

## 2010-12-07 DIAGNOSIS — K4090 Unilateral inguinal hernia, without obstruction or gangrene, not specified as recurrent: Secondary | ICD-10-CM

## 2010-12-07 DIAGNOSIS — Z0000 Encounter for general adult medical examination without abnormal findings: Secondary | ICD-10-CM

## 2010-12-07 DIAGNOSIS — B20 Human immunodeficiency virus [HIV] disease: Secondary | ICD-10-CM

## 2010-12-07 DIAGNOSIS — Z113 Encounter for screening for infections with a predominantly sexual mode of transmission: Secondary | ICD-10-CM

## 2010-12-07 LAB — COMPREHENSIVE METABOLIC PANEL
AST: 60 U/L — ABNORMAL HIGH (ref 0–37)
Alkaline Phosphatase: 85 U/L (ref 39–117)
BUN: 11 mg/dL (ref 6–23)
Creat: 0.76 mg/dL (ref 0.40–1.50)

## 2010-12-07 LAB — LIPID PANEL
HDL: 35 mg/dL — ABNORMAL LOW (ref >39)
Total CHOL/HDL Ratio: 5.3 Ratio

## 2010-12-07 NOTE — Assessment & Plan Note (Signed)
He is doing well. Will check his labs today. Offered condoms. Will have him rtc 5-6 months. Boost pneumovax today.

## 2010-12-07 NOTE — Assessment & Plan Note (Signed)
Will recheck his lipids today.  

## 2010-12-07 NOTE — Progress Notes (Signed)
Addended by: Wendall Mola on: 12/07/2010 12:23 PM   Modules accepted: Orders

## 2010-12-07 NOTE — Assessment & Plan Note (Signed)
Discussed at length with him si/sx of incarceration, medical urgency of any si/sx of incarceration. Offered to have him seen by gen-surgery but he wishes to watch and wait.

## 2010-12-07 NOTE — Progress Notes (Signed)
  Subjective:    Patient ID: Brendan Price, male    DOB: 03/12/1971, 40 y.o.   MRN: [id number]  HPI 40 yo M with hx of HIV+ and lipodystrophy. He had CD4 570 and VL undetectable (10-11). Recently called due to intractable hiccoughs. Got rx for chlopromazine.  He has been having difficulties with an inguinal hernia on the L. It is reducible, occasionally painful with sitting.  Has had some occasional constipation. He has lost his job in the last 3 months and has been taking some time off and looking for new work.     Review of Systems     Objective:   Physical Exam  Constitutional:    Eyes: Conjunctivae and EOM are normal. Pupils are equal, round, and reactive to light.  Neck: Neck supple.  Cardiovascular: Normal rate, regular rhythm and normal heart sounds.   Pulmonary/Chest: Effort normal and breath sounds normal.  Abdominal: Soft. Bowel sounds are normal. He exhibits no distension. A hernia is present. Hernia confirmed positive in the left inguinal area.  Genitourinary:             Assessment & Plan:

## 2010-12-08 LAB — CBC
Platelets: 262 10*3/uL (ref 150–400)
RDW: 13.3 % (ref 11.5–15.5)
WBC: 4.2 10*3/uL (ref 4.0–10.5)

## 2010-12-10 LAB — HIV-1 RNA QUANT-NO REFLEX-BLD
HIV 1 RNA Quant: <20 copies/mL (ref <20)
HIV-1 RNA Quant, Log: <1.3 {Log} (ref <1.30)

## 2011-01-24 ENCOUNTER — Telehealth: Payer: Self-pay | Admitting: *Deleted

## 2011-01-24 NOTE — Telephone Encounter (Signed)
Pt. Received list of immunizations.

## 2011-04-09 ENCOUNTER — Telehealth: Payer: Self-pay | Admitting: *Deleted

## 2011-04-09 NOTE — Telephone Encounter (Signed)
Called patient and left message.  Dr. Ninetta Lights has added a clinic on 04/19/11.  And if he is interested in being seen before 9/19, he can come on that date at 10:00.   If he wants to come on 8/31 the 9/19 appointment needs to be cancelled. Wendall Mola CMA

## 2011-05-08 ENCOUNTER — Ambulatory Visit (INDEPENDENT_AMBULATORY_CARE_PROVIDER_SITE_OTHER): Payer: PRIVATE HEALTH INSURANCE | Admitting: Infectious Diseases

## 2011-05-08 ENCOUNTER — Other Ambulatory Visit: Payer: Self-pay | Admitting: *Deleted

## 2011-05-08 ENCOUNTER — Encounter: Payer: Self-pay | Admitting: Infectious Diseases

## 2011-05-08 VITALS — BP 118/80 | HR 61 | Temp 98.0°F | Ht 71.0 in | Wt 157.7 lb

## 2011-05-08 DIAGNOSIS — J309 Allergic rhinitis, unspecified: Secondary | ICD-10-CM

## 2011-05-08 DIAGNOSIS — H01009 Unspecified blepharitis unspecified eye, unspecified eyelid: Secondary | ICD-10-CM

## 2011-05-08 DIAGNOSIS — Z23 Encounter for immunization: Secondary | ICD-10-CM

## 2011-05-08 DIAGNOSIS — B20 Human immunodeficiency virus [HIV] disease: Secondary | ICD-10-CM

## 2011-05-08 DIAGNOSIS — E781 Pure hyperglyceridemia: Secondary | ICD-10-CM

## 2011-05-08 DIAGNOSIS — H01003 Unspecified blepharitis right eye, unspecified eyelid: Secondary | ICD-10-CM | POA: Insufficient documentation

## 2011-05-08 LAB — CBC
HCT: 35.7 % — ABNORMAL LOW (ref 39.0–52.0)
Hemoglobin: 12.7 g/dL — ABNORMAL LOW (ref 13.0–17.0)
RBC: 2.8 MIL/uL — ABNORMAL LOW (ref 4.22–5.81)
WBC: 4.1 10*3/uL (ref 4.0–10.5)

## 2011-05-08 LAB — COMPREHENSIVE METABOLIC PANEL
AST: 43 U/L — ABNORMAL HIGH (ref 0–37)
Albumin: 4.4 g/dL (ref 3.5–5.2)
BUN: 12 mg/dL (ref 6–23)
Calcium: 9.3 mg/dL (ref 8.4–10.5)
Chloride: 106 mEq/L (ref 96–112)
Creat: 0.69 mg/dL (ref 0.50–1.35)
Glucose, Bld: 94 mg/dL (ref 70–99)
Potassium: 4.6 mEq/L (ref 3.5–5.3)

## 2011-05-08 LAB — LIPID PANEL
Cholesterol: 181 mg/dL (ref 0–200)
Total CHOL/HDL Ratio: 4.9 Ratio
VLDL: 77 mg/dL — ABNORMAL HIGH (ref 0–40)

## 2011-05-08 MED ORDER — TOBRAMYCIN-DEXAMETHASONE 0.3-0.1 % OP OINT: TOPICAL_OINTMENT | Freq: Three times a day (TID) | OPHTHALMIC | Status: AC

## 2011-05-08 MED ORDER — FLUTICASONE PROPIONATE 50 MCG/ACT NA SUSP: 1.0000 | Freq: Every day | NASAL | Status: DC | PRN

## 2011-05-08 MED ORDER — LAMIVUDINE-ZIDOVUDINE 150-300 MG PO TABS: 1.0000 | ORAL_TABLET | Freq: Two times a day (BID) | ORAL | Status: DC

## 2011-05-08 MED ORDER — MELOXICAM 15 MG PO TABS: 15.0000 mg | ORAL_TABLET | Freq: Every day | ORAL | Status: DC

## 2011-05-08 MED ORDER — EFAVIRENZ 600 MG PO TABS: 600.0000 mg | ORAL_TABLET | Freq: Every day | ORAL | Status: DC

## 2011-05-08 NOTE — Assessment & Plan Note (Signed)
tobradex is written as he asks. i ask him that he sees his ophtho if he is not improved in 1 week.

## 2011-05-08 NOTE — Progress Notes (Signed)
  Subjective:    Patient ID: Brendan Price, male    DOB: 1971-07-02, 40 y.o.   MRN: [id number]  HPI 40 yo M with hx of HIV+ and lipodystrophy. He had CD4 570 and VL undetectable, Trig 429 (12-07-10). [facility]. 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     Review of Systems     Objective:   Physical Exam  Constitutional: He appears well-developed and well-nourished.  Eyes: EOM are normal. Pupils are equal, round, and reactive to light.    Cardiovascular: Normal rate, regular rhythm and normal heart sounds.   Pulmonary/Chest: Effort normal and breath sounds normal.  Abdominal: Soft. Bowel sounds are normal.          Assessment & Plan:

## 2011-05-08 NOTE — Assessment & Plan Note (Signed)
He is doing very well. Will continue his current meds. He gets flu shot today. Will check his labs today. He will email for results. See him back in 5-6 months.

## 2011-05-08 NOTE — Assessment & Plan Note (Signed)
Will recheck his lipids today.  

## 2011-05-09 LAB — T-HELPER CELL (CD4) - (RCID CLINIC ONLY): CD4 % Helper T Cell: 37 % (ref 33–55)

## 2011-05-22 LAB — T-HELPER CELL (CD4) - (RCID CLINIC ONLY): CD4 % Helper T Cell: 29 — ABNORMAL LOW

## 2011-05-28 LAB — T-HELPER CELL (CD4) - (RCID CLINIC ONLY): CD4 T Cell Abs: 510

## 2011-06-04 LAB — T-HELPER CELL (CD4) - (RCID CLINIC ONLY)
CD4 % Helper T Cell: 24 — ABNORMAL LOW
CD4 T Cell Abs: 640

## 2011-10-30 ENCOUNTER — Ambulatory Visit (INDEPENDENT_AMBULATORY_CARE_PROVIDER_SITE_OTHER): Payer: PRIVATE HEALTH INSURANCE | Admitting: Infectious Diseases

## 2011-10-30 ENCOUNTER — Encounter: Payer: Self-pay | Admitting: Infectious Diseases

## 2011-10-30 VITALS — BP 128/80 | HR 103 | Temp 97.3°F | Ht 71.0 in | Wt 166.0 lb

## 2011-10-30 DIAGNOSIS — A630 Anogenital (venereal) warts: Secondary | ICD-10-CM

## 2011-10-30 DIAGNOSIS — Z113 Encounter for screening for infections with a predominantly sexual mode of transmission: Secondary | ICD-10-CM

## 2011-10-30 DIAGNOSIS — E781 Pure hyperglyceridemia: Secondary | ICD-10-CM

## 2011-10-30 DIAGNOSIS — B20 Human immunodeficiency virus [HIV] disease: Secondary | ICD-10-CM

## 2011-10-30 DIAGNOSIS — Z79899 Other long term (current) drug therapy: Secondary | ICD-10-CM

## 2011-10-30 LAB — RPR

## 2011-10-30 LAB — COMPREHENSIVE METABOLIC PANEL
AST: 53 U/L — ABNORMAL HIGH (ref 0–37)
BUN: 14 mg/dL (ref 6–23)
CO2: 25 mEq/L (ref 19–32)
Calcium: 9.5 mg/dL (ref 8.4–10.5)
Chloride: 103 mEq/L (ref 96–112)
Creat: 0.77 mg/dL (ref 0.50–1.35)

## 2011-10-30 LAB — CBC WITH DIFFERENTIAL/PLATELET
Basophils Absolute: 0 10*3/uL (ref 0.0–0.1)
Eosinophils Relative: 4 % (ref 0–5)
HCT: 34.6 % — ABNORMAL LOW (ref 39.0–52.0)
Lymphocytes Relative: 47 % — ABNORMAL HIGH (ref 12–46)
MCV: 123.6 fL — ABNORMAL HIGH (ref 78.0–100.0)
Monocytes Absolute: 0.4 10*3/uL (ref 0.1–1.0)
RDW: 12.8 % (ref 11.5–15.5)
WBC: 3.9 10*3/uL — ABNORMAL LOW (ref 4.0–10.5)

## 2011-10-30 LAB — LIPID PANEL: Cholesterol: 186 mg/dL (ref 0–200)

## 2011-10-30 NOTE — Progress Notes (Signed)
  Subjective:    Patient ID: Dellis Voght, male    DOB: 1971-05-05, 41 y.o.   MRN: [id number]  HPI 41 yo M with hx of HIV+, hyperlipidemia, and lipodystrophy. Has been busy with work. Feels like his L  inguinal hernia has gotten a little larger, going back to see urology for this. No pain but feels it has gotten bigger. Can feel it pushing when he sleeps on his side.  Been watching diet, not exercising as much as he should, working 2nd shift. Been sleeping well.  Wt up 9#.     Review of Systems     Objective:   Physical Exam  Constitutional: He appears well-developed and well-nourished.  HENT:  Mouth/Throat: No oropharyngeal exudate.  Eyes: EOM are normal. Pupils are equal, round, and reactive to light.  Neck: Neck supple.  Cardiovascular: Normal rate, regular rhythm and normal heart sounds.   Pulmonary/Chest: Effort normal and breath sounds normal.  Abdominal: Soft. Bowel sounds are normal. He exhibits no distension. There is no tenderness.  Lymphadenopathy:    He has no cervical adenopathy.          Assessment & Plan:

## 2011-10-30 NOTE — Assessment & Plan Note (Signed)
He continues to have very elevated lipids. Will recheck them today. He has been trying to watch his diet but his weight gain argues that he may have difficulty with this. Most likely will need lopid or higher potency statin.

## 2011-10-30 NOTE — Assessment & Plan Note (Signed)
He continues to do very well. We did not talk about simplifying his regimen today. He is offered condoms (refuses). Vaccines are up to date. He will email me for the results of his labs.  Will see him back in 6 months

## 2011-10-30 NOTE — Assessment & Plan Note (Signed)
Improved, has had derm f/u.

## 2011-11-01 LAB — HIV-1 RNA ULTRAQUANT REFLEX TO GENTYP+
HIV 1 RNA Quant: <20 copies/mL (ref <20)
HIV-1 RNA Quant, Log: <1.3 {Log} (ref <1.30)

## 2011-11-01 LAB — T-HELPER CELL (CD4) - (RCID CLINIC ONLY): CD4 % Helper T Cell: 40 % (ref 33–55)

## 2012-04-06 ENCOUNTER — Ambulatory Visit (INDEPENDENT_AMBULATORY_CARE_PROVIDER_SITE_OTHER): Payer: PRIVATE HEALTH INSURANCE | Admitting: Infectious Diseases

## 2012-04-06 ENCOUNTER — Encounter: Payer: Self-pay | Admitting: Infectious Diseases

## 2012-04-06 VITALS — BP 132/78 | HR 79 | Temp 97.7°F | Ht 71.0 in | Wt 161.0 lb

## 2012-04-06 DIAGNOSIS — R040 Epistaxis: Secondary | ICD-10-CM | POA: Insufficient documentation

## 2012-04-06 DIAGNOSIS — B20 Human immunodeficiency virus [HIV] disease: Secondary | ICD-10-CM

## 2012-04-06 DIAGNOSIS — K4090 Unilateral inguinal hernia, without obstruction or gangrene, not specified as recurrent: Secondary | ICD-10-CM

## 2012-04-06 LAB — COMPREHENSIVE METABOLIC PANEL
AST: 72 U/L — ABNORMAL HIGH (ref 0–37)
Albumin: 4.4 g/dL (ref 3.5–5.2)
BUN: 18 mg/dL (ref 6–23)
Calcium: 9.4 mg/dL (ref 8.4–10.5)
Chloride: 105 mEq/L (ref 96–112)
Glucose, Bld: 92 mg/dL (ref 70–99)
Potassium: 4.1 mEq/L (ref 3.5–5.3)
Sodium: 140 mEq/L (ref 135–145)
Total Protein: 7.4 g/dL (ref 6.0–8.3)

## 2012-04-06 LAB — CBC WITH DIFFERENTIAL/PLATELET
HCT: 34.3 % — ABNORMAL LOW (ref 39.0–52.0)
Hemoglobin: 12.6 g/dL — ABNORMAL LOW (ref 13.0–17.0)
Lymphocytes Relative: 35 % (ref 12–46)
Monocytes Absolute: 0.5 10*3/uL (ref 0.1–1.0)
Monocytes Relative: 12 % (ref 3–12)
Neutro Abs: 2.2 10*3/uL (ref 1.7–7.7)
RBC: 2.8 MIL/uL — ABNORMAL LOW (ref 4.22–5.81)
WBC: 4.5 10*3/uL (ref 4.0–10.5)

## 2012-04-06 NOTE — Assessment & Plan Note (Signed)
Will have him seen by ENT. He is fairly concerned about his black eye and nose bleeds.

## 2012-04-06 NOTE — Assessment & Plan Note (Signed)
He is doing well. Needs to get labs done. He is offered condoms. Will see him back in 6 months.

## 2012-04-06 NOTE — Progress Notes (Signed)
  Subjective:    Patient ID: Brendan Price, male    DOB: 1970/10/07, 41 y.o.   MRN: [id number]  HPI 41 yo M with hx of HIV+, hyperlipidemia, lipodystrophy, and L inguinal hernia.  [place] on [place] and got black eye. Has since had episodes of spitting blood. Has quit taking ibuprofen.  Still having problems with inguinal hernia, when sleeping on his side can feel it move.  Not painful, no change in BM.   HIV 1 RNA Quant (copies/mL)  Date Value  10/30/2011 <20   05/08/2011 <20   12/07/2010 <20      CD4 T Cell Abs (cmm)  Date Value  10/30/2011 770   05/08/2011 720   12/07/2010 570       Review of Systems     Objective:   Physical Exam  Constitutional: He appears well-developed and well-nourished.  HENT:  Head:    Nose:    Mouth/Throat: No oropharyngeal exudate.  Eyes: EOM are normal. Pupils are equal, round, and reactive to light.  Neck: Neck supple.  Cardiovascular: Normal rate, regular rhythm and normal heart sounds.   Pulmonary/Chest: Effort normal and breath sounds normal.  Abdominal: Soft. Bowel sounds are normal. He exhibits no distension.  Musculoskeletal: He exhibits no edema.  Lymphadenopathy:    He has no cervical adenopathy.          Assessment & Plan:

## 2012-04-06 NOTE — Assessment & Plan Note (Signed)
Will have him seen by general surgery 

## 2012-04-06 NOTE — Addendum Note (Signed)
Addended by: Mariea Clonts D on: 04/06/2012 10:27 AM   Modules accepted: Orders

## 2012-04-07 NOTE — Addendum Note (Signed)
Addended by: Mariea Clonts D on: 04/07/2012 04:13 PM   Modules accepted: Orders

## 2012-04-08 ENCOUNTER — Other Ambulatory Visit (INDEPENDENT_AMBULATORY_CARE_PROVIDER_SITE_OTHER): Payer: PRIVATE HEALTH INSURANCE

## 2012-04-08 DIAGNOSIS — B20 Human immunodeficiency virus [HIV] disease: Secondary | ICD-10-CM

## 2012-04-08 LAB — T-HELPER CELL (CD4) - (RCID CLINIC ONLY)
CD4 % Helper T Cell: 41 % (ref 33–55)
CD4 T Cell Abs: 630 uL (ref 400–2700)

## 2012-04-09 LAB — HIV-1 RNA QUANT-NO REFLEX-BLD
HIV 1 RNA Quant: <20 copies/mL (ref <20)
HIV-1 RNA Quant, Log: <1.3 {Log} (ref <1.30)

## 2012-04-21 ENCOUNTER — Ambulatory Visit (INDEPENDENT_AMBULATORY_CARE_PROVIDER_SITE_OTHER): Payer: Self-pay | Admitting: General Surgery

## 2012-04-28 ENCOUNTER — Ambulatory Visit (INDEPENDENT_AMBULATORY_CARE_PROVIDER_SITE_OTHER): Payer: PRIVATE HEALTH INSURANCE | Admitting: General Surgery

## 2012-04-28 ENCOUNTER — Encounter (INDEPENDENT_AMBULATORY_CARE_PROVIDER_SITE_OTHER): Payer: Self-pay | Admitting: General Surgery

## 2012-04-28 VITALS — BP 120/78 | HR 72 | Temp 97.9°F | Resp 18 | Ht 71.0 in | Wt 173.0 lb

## 2012-04-28 DIAGNOSIS — K4090 Unilateral inguinal hernia, without obstruction or gangrene, not specified as recurrent: Secondary | ICD-10-CM

## 2012-04-28 NOTE — Progress Notes (Signed)
Chief Complaint  Patient presents with  . Inguinal Hernia    HISTORY:  Brendan Price is a 41 y.o. male who presents to clinic with concern for an inguinal hernia.  He has noticed this for about a year.  Can feel discomfort with certain movements.  No nausea, vomiting or fevers.  No history of incarceration/stragulation.  Past Medical History  Diagnosis Date  . GERD (gastroesophageal reflux disease)   . HIV infection        Past Surgical History  Procedure Date  . Ureteroscopy 12/1996  . Lithotripsy 2000  . Tonsillectomy 1977      Current Outpatient Prescriptions  Medication Sig Dispense Refill  . aspirin 81 MG EC tablet Take 81 mg by mouth daily.        . cetirizine (ZYRTEC) 10 MG tablet Take 10 mg by mouth 2 (two) times daily.        . chlorproMAZINE (THORAZINE) 25 MG tablet Take 25 mg by mouth. Take one tablet every 4-6 hours      . efavirenz (SUSTIVA) 600 MG tablet Take 1 tablet (600 mg total) by mouth at bedtime.  90 tablet  3  . famotidine (PEPCID) 20 MG tablet Take 20 mg by mouth 2 (two) times daily.        . fluticasone (FLONASE) 50 MCG/ACT nasal spray Place 1-2 sprays into the nose daily as needed.  48 g  3  . lamiVUDine-zidovudine (COMBIVIR) 150-300 MG per tablet Take 1 tablet by mouth 2 (two) times daily.  180 tablet  3  . LORazepam (ATIVAN) 1 MG tablet Take 1 mg by mouth every 8 (eight) hours as needed.        . meloxicam (MOBIC) 15 MG tablet Take 1 tablet (15 mg total) by mouth daily.  90 tablet  3  . omeprazole (PRILOSEC) 20 MG capsule Take 20 mg by mouth daily.        Marland Kitchen zolpidem (AMBIEN) 10 MG tablet Take 10 mg by mouth at bedtime as needed.           Allergies  Allergen Reactions  . Ampicillin       Family History  No pertinent history  History   Social History  . Marital Status: Single    Spouse Name: N/A    Number of Children: N/A  . Years of Education: N/A   Social History Main Topics  . Smoking status: Never Smoker   . Smokeless tobacco: Never  Used  . Alcohol Use: 1.0 oz/week    2 drink(s) per week     occasional  . Drug Use: No  . Sexually Active: No     pt. declined condoms   Other Topics Concern  . None   Social History Narrative  . None       REVIEW OF SYSTEMS - PERTINENT POSITIVES ONLY: Review of Systems - General ROS: negative for - chills, fatigue, fever or weight loss Hematological and Lymphatic ROS: negative for - bleeding problems, blood clots or bruising Respiratory ROS: no cough, shortness of breath, or wheezing Cardiovascular ROS: no chest pain or dyspnea on exertion Gastrointestinal ROS: positive for - heartburn negative for - abdominal pain, blood in stools, constipation or diarrhea Genito-Urinary ROS: no dysuria, trouble voiding, or hematuria  EXAM: Filed Vitals:   04/28/12 0902  BP: 120/78  Pulse: 72  Temp: 97.9 F (36.6 C)  Resp: 18    General appearance: alert and no distress Resp: clear to auscultation bilaterally Cardio: regular  rate and rhythm GI: soft, non-tender; bowel sounds normal; no masses,  no organomegaly Hernia- Moderate L ing hernia, reducible.  Small R Ing hernia.  Very small umbilical hernia.   LABORATORY RESULTS: Available labs are reviewed  Last CD4: 630   ASSESSMENT AND PLAN: Brendan Price is a 41 y.o. male who was referred to my office for a L inguinal hernia.  He does have a moderate sized, reducible L inguinal hernia as well as a small R inguinal hernia and a very small umbilical hernia.  I think he would be a good candidate for a laparoscopic repair.  The patient would like to wait for a few more months before he undergoes this procedure.  I have given him some information on hernias, including the warning signs of incarceration and strangulation.  I have also given him a list of my partners that perform laparoscopic inguinal hernia repairs.  He will call the office and schedule an apt to see one of them about 2 months before he is ready to do his  surgery.    Vanita Panda, MD Colon and Rectal Surgery / General Surgery [facility]      Visit Diagnoses: 1. Inguinal hernia     Primary Care Physician: Lillia Mountain, MD

## 2012-04-28 NOTE — Patient Instructions (Signed)
You have a left inguinal hernia and possibly a developing R inguinal hernia.  Please call to schedule an appointment a couple of months in advance of when you would like to get this fixed.  I have given you a list of my partners that do laparoscopic inguinal hernia repairs.  We also will give you a book that discusses hernia repair for you to read.  Please call if you have any questions.

## 2012-05-01 ENCOUNTER — Other Ambulatory Visit: Payer: Self-pay | Admitting: Infectious Diseases

## 2012-05-30 ENCOUNTER — Other Ambulatory Visit: Payer: Self-pay | Admitting: Infectious Diseases

## 2012-06-10 ENCOUNTER — Other Ambulatory Visit: Payer: Self-pay | Admitting: *Deleted

## 2012-06-10 DIAGNOSIS — B20 Human immunodeficiency virus [HIV] disease: Secondary | ICD-10-CM

## 2012-06-10 MED ORDER — EFAVIRENZ 600 MG PO TABS: 600.0000 mg | ORAL_TABLET | Freq: Every day | ORAL | Status: DC

## 2012-06-10 MED ORDER — LAMIVUDINE-ZIDOVUDINE 150-300 MG PO TABS: 1.0000 | ORAL_TABLET | Freq: Two times a day (BID) | ORAL | Status: DC

## 2012-06-30 ENCOUNTER — Other Ambulatory Visit: Payer: Self-pay | Admitting: Infectious Diseases

## 2012-08-06 ENCOUNTER — Encounter: Payer: Self-pay | Admitting: Infectious Diseases

## 2012-08-22 ENCOUNTER — Other Ambulatory Visit: Payer: Self-pay | Admitting: Infectious Diseases

## 2012-09-20 ENCOUNTER — Other Ambulatory Visit: Payer: Self-pay | Admitting: Infectious Diseases

## 2012-09-21 ENCOUNTER — Other Ambulatory Visit: Payer: Self-pay | Admitting: Licensed Clinical Social Worker

## 2012-09-21 DIAGNOSIS — M797 Fibromyalgia: Secondary | ICD-10-CM

## 2012-09-21 MED ORDER — MELOXICAM 15 MG PO TABS: 15.0000 mg | ORAL_TABLET | Freq: Every day | ORAL | Status: DC

## 2012-09-23 ENCOUNTER — Other Ambulatory Visit: Payer: PRIVATE HEALTH INSURANCE

## 2012-09-23 DIAGNOSIS — B20 Human immunodeficiency virus [HIV] disease: Secondary | ICD-10-CM

## 2012-09-23 DIAGNOSIS — Z113 Encounter for screening for infections with a predominantly sexual mode of transmission: Secondary | ICD-10-CM

## 2012-09-23 DIAGNOSIS — Z79899 Other long term (current) drug therapy: Secondary | ICD-10-CM

## 2012-09-23 LAB — CBC WITH DIFFERENTIAL/PLATELET
Hemoglobin: 12.7 g/dL — ABNORMAL LOW (ref 13.0–17.0)
Lymphocytes Relative: 50 % — ABNORMAL HIGH (ref 12–46)
Lymphs Abs: 2.3 10*3/uL (ref 0.7–4.0)
Monocytes Relative: 14 % — ABNORMAL HIGH (ref 3–12)
Neutro Abs: 1.4 10*3/uL — ABNORMAL LOW (ref 1.7–7.7)
Neutrophils Relative %: 31 % — ABNORMAL LOW (ref 43–77)
Platelets: 272 10*3/uL (ref 150–400)
RBC: 2.83 MIL/uL — ABNORMAL LOW (ref 4.22–5.81)
WBC: 4.5 10*3/uL (ref 4.0–10.5)

## 2012-09-23 LAB — COMPREHENSIVE METABOLIC PANEL
AST: 71 U/L — ABNORMAL HIGH (ref 0–37)
Alkaline Phosphatase: 67 U/L (ref 39–117)
BUN: 19 mg/dL (ref 6–23)
Creat: 0.8 mg/dL (ref 0.50–1.35)
Total Bilirubin: 0.6 mg/dL (ref 0.3–1.2)

## 2012-09-23 LAB — LIPID PANEL
Cholesterol: 205 mg/dL — ABNORMAL HIGH (ref 0–200)
HDL: 30 mg/dL — ABNORMAL LOW (ref >39)
Total CHOL/HDL Ratio: 6.8 Ratio
Triglycerides: 693 mg/dL — ABNORMAL HIGH (ref <150)

## 2012-09-23 LAB — RPR

## 2012-09-24 LAB — T-HELPER CELL (CD4) - (RCID CLINIC ONLY): CD4 % Helper T Cell: 40 % (ref 33–55)

## 2012-10-07 ENCOUNTER — Ambulatory Visit (INDEPENDENT_AMBULATORY_CARE_PROVIDER_SITE_OTHER): Payer: PRIVATE HEALTH INSURANCE | Admitting: Infectious Diseases

## 2012-10-07 ENCOUNTER — Encounter: Payer: Self-pay | Admitting: Infectious Diseases

## 2012-10-07 VITALS — BP 130/90 | HR 77 | Temp 97.9°F | Ht 71.0 in | Wt 166.0 lb

## 2012-10-07 DIAGNOSIS — K4090 Unilateral inguinal hernia, without obstruction or gangrene, not specified as recurrent: Secondary | ICD-10-CM

## 2012-10-07 DIAGNOSIS — B20 Human immunodeficiency virus [HIV] disease: Secondary | ICD-10-CM

## 2012-10-07 DIAGNOSIS — E781 Pure hyperglyceridemia: Secondary | ICD-10-CM

## 2012-10-07 DIAGNOSIS — Z21 Asymptomatic human immunodeficiency virus [HIV] infection status: Secondary | ICD-10-CM

## 2012-10-07 DIAGNOSIS — A630 Anogenital (venereal) warts: Secondary | ICD-10-CM

## 2012-10-07 MED ORDER — LAMIVUDINE-ZIDOVUDINE 150-300 MG PO TABS: 1.0000 | ORAL_TABLET | Freq: Two times a day (BID) | ORAL | Status: DC

## 2012-10-07 MED ORDER — EFAVIRENZ 600 MG PO TABS: 600.0000 mg | ORAL_TABLET | Freq: Every day | ORAL | Status: DC

## 2012-10-07 NOTE — Progress Notes (Signed)
  Subjective:    Patient ID: Brendan Price, male    DOB: October 25, 1970, 42 y.o.   MRN: [id number]  HPI 42 yo M with hx of HIV+ since 2006, lipodystrohy, anal condyloma, hernia. Has been maintained on CBV/EFV.  Was seen in early January- had colonoscopy (normal) due to his dad having benign polyps before he was 60. Rec for 5 yr f/u.  Hernia is slightly larger. Rec for reduction which pt defered. No problems (pain, feelings of non-reducibility). He is trying to wait til 2015 due to $/time off concerns.  No further epistaxis.  Sleeping well.  HIV 1 RNA Quant (copies/mL)  Date Value  09/23/2012 <20   04/08/2012 <20   10/30/2011 <20      CD4 T Cell Abs (cmm)  Date Value  09/23/2012 880   04/06/2012 630   10/30/2011 770       Review of Systems See HPI    Objective:   Physical Exam  Constitutional: He appears well-developed and well-nourished.  HENT:  Mouth/Throat: No oropharyngeal exudate.  Eyes: EOM are normal. Pupils are equal, round, and reactive to light.  Neck: Neck supple.  Cardiovascular: Normal rate, regular rhythm and normal heart sounds.   Pulmonary/Chest: Effort normal and breath sounds normal.  Abdominal: Soft. Bowel sounds are normal. There is no tenderness.  Musculoskeletal: He exhibits no edema.  Lymphadenopathy:    He has no cervical adenopathy.          Assessment & Plan:

## 2012-10-07 NOTE — Assessment & Plan Note (Signed)
He has previously been seen by Dr Maisie Fus for his hernia eval. Not clear if his colonoscopy will provide adequate screening for rectal lesions?

## 2012-10-07 NOTE — Assessment & Plan Note (Signed)
asx currently. He will f/u with surgery when he has time/money accrued from work.

## 2012-10-07 NOTE — Assessment & Plan Note (Signed)
He has f/u with Dr Valentina Lucks, he attributes this to his house work and eating out a lot. He most likely needs to be on gemfibrozil. I encouraged him to work on diet and exercise til PCP f/u.

## 2012-10-07 NOTE — Assessment & Plan Note (Signed)
He is doing very well. Only exception is his lipids. We have previously discussed putting him on single tablet regiemens buthe has been hesitant. He is offered condoms/refuses. vax are up to date. He will come back in 6-12 months.

## 2012-11-12 ENCOUNTER — Encounter: Payer: Self-pay | Admitting: Infectious Diseases

## 2012-11-13 ENCOUNTER — Other Ambulatory Visit: Payer: Self-pay | Admitting: Infectious Diseases

## 2012-11-13 ENCOUNTER — Other Ambulatory Visit: Payer: Self-pay | Admitting: *Deleted

## 2012-11-13 DIAGNOSIS — J302 Other seasonal allergic rhinitis: Secondary | ICD-10-CM

## 2012-11-13 DIAGNOSIS — E781 Pure hyperglyceridemia: Secondary | ICD-10-CM

## 2012-11-13 MED ORDER — ASPIRIN 81 MG PO TBEC: 81.0000 mg | DELAYED_RELEASE_TABLET | Freq: Every day | ORAL | Status: DC

## 2012-11-13 MED ORDER — FLUTICASONE PROPIONATE 50 MCG/ACT NA SUSP: 1.0000 | Freq: Every day | NASAL | Status: DC | PRN

## 2012-11-25 ENCOUNTER — Other Ambulatory Visit: Payer: Self-pay | Admitting: Licensed Clinical Social Worker

## 2012-11-25 DIAGNOSIS — M797 Fibromyalgia: Secondary | ICD-10-CM

## 2012-11-25 MED ORDER — MELOXICAM 15 MG PO TABS: 15.0000 mg | ORAL_TABLET | Freq: Every day | ORAL | Status: DC

## 2013-01-17 ENCOUNTER — Ambulatory Visit: Payer: PRIVATE HEALTH INSURANCE

## 2013-01-17 ENCOUNTER — Ambulatory Visit (INDEPENDENT_AMBULATORY_CARE_PROVIDER_SITE_OTHER): Payer: PRIVATE HEALTH INSURANCE | Admitting: Family Medicine

## 2013-01-17 VITALS — BP 138/83 | HR 102 | Temp 98.6°F | Resp 18 | Ht 71.0 in | Wt 167.2 lb

## 2013-01-17 DIAGNOSIS — M25539 Pain in unspecified wrist: Secondary | ICD-10-CM

## 2013-01-17 DIAGNOSIS — S5290XA Unspecified fracture of unspecified forearm, initial encounter for closed fracture: Secondary | ICD-10-CM

## 2013-01-17 DIAGNOSIS — M25532 Pain in left wrist: Secondary | ICD-10-CM

## 2013-01-17 DIAGNOSIS — S5292XA Unspecified fracture of left forearm, initial encounter for closed fracture: Secondary | ICD-10-CM

## 2013-01-17 DIAGNOSIS — IMO0002 Reserved for concepts with insufficient information to code with codable children: Secondary | ICD-10-CM

## 2013-01-17 DIAGNOSIS — T148XXA Other injury of unspecified body region, initial encounter: Secondary | ICD-10-CM

## 2013-01-17 NOTE — Patient Instructions (Signed)
Wear the splint at all times.  Do not get it wet.  Wear the sling for comfort.

## 2013-01-17 NOTE — Progress Notes (Signed)
  Subjective:    Patient ID: Brendan Price, male    DOB: 05/27/1971, 42 y.o.   MRN: [id number]  HPI  This 42 y.o. male presents for evaluation of LEFT wrist pain. Missed a step and fell, landing against the door with his face.  The door was hollow, and his head went through both the exterior and interior panels.  He sustained abrasions to the face, scalp, neck and both knees.  Tetanus 2011 is documented. Left wrist hurts, and is swollen.  Not sure how he landed on it.  Pain on the ulnar aspect with rotation of the wrist.  Past medical history, surgical history, family history, social history and problem list reviewed.  Review of Systems As above.  No loss of consciousness.  No HA, GI upset, visual disturbance.    Objective:   Physical Exam  Blood pressure 138/83, pulse 102, temperature 98.6 F (37 C), temperature source Oral, resp. rate 18, height 5\' 11"  (1.803 m), weight 167 lb 3.2 oz (75.841 kg), SpO2 99.00%. Body mass index is 23.33 kg/(m^2). Well-developed, well nourished WM who is awake, alert and oriented, in NAD. HEENT: [place]/AT, PERRL, EOMI.  Sclera and conjunctiva are clear.  EAC are patent, TMs are normal in appearance. Nasal mucosa is pink and moist. OP is clear. Neck: supple, non-tender, no lymphadenopathy, thyromegaly. Heart: RRR, no murmur Lungs: normal effort, CTA Abdomen: normo-active bowel sounds, supple, non-tender, no mass or organomegaly. Extremities: no cyanosis, clubbing.  4 cm area of swelling just proximal to the anatomical snuff box of the LEFT wrist.  Tender.  Pain with flexion and extension.  Pronation-supination causes pain along the ulnar aspect of the wrist on the volar surface.  Pain with grasping, but good strength.  Neurovascularly intact. Skin: warm and dry without rash. Abrasions of the LEFT cheek, forehead and scalp, anterior neck, and both knees. Psychologic: good mood and appropriate affect, normal speech and behavior.  LEFT Wrist: [facility] reading (PRIMARY)  by  Dr. Neva Seat.  Non-displaced fracture of the distal radius.  No involvement of the radioulnar surface.      Assessment & Plan:  Wrist pain, acute, left - Plan: DG Wrist Complete Left  Radius fracture, left, closed, initial encounter - Plan: Ambulatory referral to Orthopedic Surgery; Sugar tong splint placed, sling. Has meloxicam at home.  Abrasion/Contusion - local wound care.  Fernande Bras, PA-C Physician Assistant-Certified [facility]

## 2013-01-19 NOTE — Progress Notes (Signed)
Xray read and patient discussed with Ms. Leotis Shames. Agree with assessment and plan of care per her note. Radiology report noted: Findings: A nondisplaced intrarticular fracture is present in the distal radius the carpal bones are intact. Soft tissue swelling is  noted over the dorsum of the wrist.  IMPRESSION:  Nondisplaced intrarticular fracture of the distal radius with  associated soft tissue swelling.

## 2013-03-10 ENCOUNTER — Telehealth: Payer: Self-pay | Admitting: *Deleted

## 2013-03-10 NOTE — Telephone Encounter (Signed)
Pt returned call to reschedule his appointment from 04/07/13 with Dr. Ninetta Lights.  Due to patient's work schedule, pt was given appointment for 03/29/13 at 9:15am with labs still to be collected 03/24/13. Pt aware that not all labs may be resulted by his appointment, verbalized understanding. Andree Coss, RN

## 2013-03-22 ENCOUNTER — Other Ambulatory Visit: Payer: Self-pay | Admitting: *Deleted

## 2013-03-22 DIAGNOSIS — M797 Fibromyalgia: Secondary | ICD-10-CM

## 2013-03-22 MED ORDER — MELOXICAM 15 MG PO TABS: 15.0000 mg | ORAL_TABLET | Freq: Every day | ORAL | Status: DC

## 2013-03-24 ENCOUNTER — Other Ambulatory Visit: Payer: PRIVATE HEALTH INSURANCE

## 2013-03-29 ENCOUNTER — Encounter: Payer: Self-pay | Admitting: Infectious Diseases

## 2013-03-29 ENCOUNTER — Ambulatory Visit (INDEPENDENT_AMBULATORY_CARE_PROVIDER_SITE_OTHER): Payer: PRIVATE HEALTH INSURANCE | Admitting: Infectious Diseases

## 2013-03-29 VITALS — BP 129/89 | HR 73 | Temp 98.2°F | Ht 71.0 in | Wt 168.0 lb

## 2013-03-29 DIAGNOSIS — Z21 Asymptomatic human immunodeficiency virus [HIV] infection status: Secondary | ICD-10-CM

## 2013-03-29 DIAGNOSIS — E781 Pure hyperglyceridemia: Secondary | ICD-10-CM

## 2013-03-29 DIAGNOSIS — B20 Human immunodeficiency virus [HIV] disease: Secondary | ICD-10-CM

## 2013-03-29 DIAGNOSIS — K4090 Unilateral inguinal hernia, without obstruction or gangrene, not specified as recurrent: Secondary | ICD-10-CM

## 2013-03-29 DIAGNOSIS — A630 Anogenital (venereal) warts: Secondary | ICD-10-CM

## 2013-03-29 DIAGNOSIS — E881 Lipodystrophy, not elsewhere classified: Secondary | ICD-10-CM | POA: Insufficient documentation

## 2013-03-29 LAB — COMPREHENSIVE METABOLIC PANEL
ALT: 85 U/L — ABNORMAL HIGH (ref 0–53)
AST: 50 U/L — ABNORMAL HIGH (ref 0–37)
Albumin: 4.4 g/dL (ref 3.5–5.2)
Alkaline Phosphatase: 74 U/L (ref 39–117)
Calcium: 9.6 mg/dL (ref 8.4–10.5)
Chloride: 104 mEq/L (ref 96–112)
Potassium: 4.1 mEq/L (ref 3.5–5.3)
Sodium: 139 mEq/L (ref 135–145)

## 2013-03-29 LAB — CBC
HCT: 34 % — ABNORMAL LOW (ref 39.0–52.0)
MCH: 44.2 pg — ABNORMAL HIGH (ref 26.0–34.0)
MCV: 122.3 fL — ABNORMAL HIGH (ref 78.0–100.0)
Platelets: 283 10*3/uL (ref 150–400)
RDW: 13.5 % (ref 11.5–15.5)
WBC: 4 10*3/uL (ref 4.0–10.5)

## 2013-03-29 LAB — LIPID PANEL
HDL: 33 mg/dL — ABNORMAL LOW (ref >39)
Total CHOL/HDL Ratio: 5.5 Ratio

## 2013-03-29 NOTE — Assessment & Plan Note (Signed)
Not been adherent with diet. Probably needs statin or fibrate.

## 2013-03-29 NOTE — Progress Notes (Signed)
  Subjective:    Patient ID: Brendan Price, male    DOB: Sep 23, 1970, 42 y.o.   MRN: [id number]  HPI 42 yo M with hx of HIV+ since 2002, lipodystrohy, anal condyloma, hernia. Has been maintained on CBV/EFV.  Was seen in early January- had colonoscopy (normal) due to his dad having benign polyps before he was 60. Rec for 5 yr f/u.  Hernia is slightly larger. Rec for resection, will have done beginning of 2015.  Still reducible.  Only using flonase prn, rarely, due to previous retinal problem. Difficulty with diet, had kitchen renovated.   Review of Systems     Objective:   Physical Exam  Constitutional: He appears well-developed and well-nourished.  HENT:  Mouth/Throat: No oropharyngeal exudate.  Eyes: EOM are normal. Pupils are equal, round, and reactive to light.  Cardiovascular: Normal rate, regular rhythm and normal heart sounds.   Pulmonary/Chest: Effort normal and breath sounds normal.  Abdominal: Soft. Bowel sounds are normal. There is no tenderness.  Lymphadenopathy:    He has no cervical adenopathy.          Assessment & Plan:

## 2013-03-29 NOTE — Addendum Note (Signed)
Addended bySteva Colder on: 03/29/2013 11:00 AM   Modules accepted: Orders

## 2013-03-29 NOTE — Assessment & Plan Note (Signed)
He will call if he has problems. Otherwise, plan on surgery in early 2015.

## 2013-03-29 NOTE — Assessment & Plan Note (Signed)
He is doing well. Repeat his labs. Offered/refused condoms. rtc in 6  Months.

## 2013-03-29 NOTE — Assessment & Plan Note (Signed)
He has previously been hesitant to switch his ART. I also tried to get him to switch his fluticasone (which interacts with many ARTs) to beclomethasone but he was hesitant to do this as well.

## 2013-03-29 NOTE — Assessment & Plan Note (Signed)
Had colonoscopy. Told "everything was normal". He denies further lesions.

## 2013-03-30 LAB — T-HELPER CELL (CD4) - (RCID CLINIC ONLY)
CD4 % Helper T Cell: 36 % (ref 33–55)
CD4 T Cell Abs: 730 uL (ref 400–2700)

## 2013-04-07 ENCOUNTER — Ambulatory Visit: Payer: PRIVATE HEALTH INSURANCE | Admitting: Infectious Diseases

## 2013-04-22 ENCOUNTER — Encounter: Payer: Self-pay | Admitting: Infectious Diseases

## 2013-05-18 ENCOUNTER — Encounter: Payer: Self-pay | Admitting: Infectious Diseases

## 2013-05-20 ENCOUNTER — Encounter: Payer: Self-pay | Admitting: Infectious Diseases

## 2013-06-23 ENCOUNTER — Other Ambulatory Visit: Payer: Self-pay | Admitting: *Deleted

## 2013-06-23 DIAGNOSIS — M797 Fibromyalgia: Secondary | ICD-10-CM

## 2013-06-23 MED ORDER — MELOXICAM 15 MG PO TABS: 15.0000 mg | ORAL_TABLET | Freq: Every day | ORAL | Status: DC

## 2013-08-03 ENCOUNTER — Ambulatory Visit (INDEPENDENT_AMBULATORY_CARE_PROVIDER_SITE_OTHER): Payer: PRIVATE HEALTH INSURANCE | Admitting: General Surgery

## 2013-08-03 ENCOUNTER — Encounter (INDEPENDENT_AMBULATORY_CARE_PROVIDER_SITE_OTHER): Payer: Self-pay | Admitting: General Surgery

## 2013-08-03 VITALS — BP 140/72 | HR 100 | Temp 97.1°F | Resp 16 | Ht 71.0 in | Wt 169.0 lb

## 2013-08-03 DIAGNOSIS — K429 Umbilical hernia without obstruction or gangrene: Secondary | ICD-10-CM

## 2013-08-03 DIAGNOSIS — K4020 Bilateral inguinal hernia, without obstruction or gangrene, not specified as recurrent: Secondary | ICD-10-CM

## 2013-08-03 NOTE — Patient Instructions (Signed)
[facility]  UMBILICAL OR INGUINAL HERNIA REPAIR: POST OP INSTRUCTIONS  Always review your discharge instruction sheet given to you by the facility where your surgery was performed. IF YOU HAVE DISABILITY OR FAMILY LEAVE FORMS, YOU MUST BRING THEM TO THE OFFICE FOR PROCESSING.   DO NOT GIVE THEM TO YOUR DOCTOR.  1. A  prescription for pain medication may be given to you upon discharge.  Take your pain medication as prescribed, if needed.  If narcotic pain medicine is not needed, then you may take acetaminophen (Tylenol) or ibuprofen (Advil) as needed. 2. Take your usually prescribed medications unless otherwise directed. 3. If you need a refill on your pain medication, please contact your pharmacy.  They will contact our office to request authorization. Prescriptions will not be filled after 5 pm or on week-ends. 4. You should follow a light diet the first 24 hours after arrival home, such as soup and crackers, etc.  Be sure to include lots of fluids daily.  Resume your normal diet the day after surgery. 5. Most patients will experience some swelling and bruising around the umbilicus or in the groin and scrotum.  Ice packs and reclining will help.  Swelling and bruising can take several days to resolve.  6. It is common to experience some constipation if taking pain medication after surgery.  Increasing fluid intake and taking a stool softener (such as Colace) will usually help or prevent this problem from occurring.  A mild laxative (Milk of Magnesia or Miralax) should be taken according to package directions if there are no bowel movements after 48 hours. 7. Unless discharge instructions indicate otherwise, you may remove your bandages 24-48 hours after surgery, and you may shower at that time.  You may have steri-strips (small skin tapes) in place directly over the incision.  These strips should be left on the skin for 7-10 days.  If your surgeon used skin glue on the  incision, you may shower in 24 hours.  The glue will flake off over the next 2-3 weeks.  Any sutures or staples will be removed at the office during your follow-up visit. 8. ACTIVITIES:  You may resume regular (light) daily activities beginning the next day--such as daily self-care, walking, climbing stairs--gradually increasing activities as tolerated.  You may have sexual intercourse when it is comfortable.  Refrain from any heavy lifting or straining until approved by your doctor. a. You may drive when you are no longer taking prescription pain medication, you can comfortably wear a seatbelt, and you can safely maneuver your car and apply brakes. b. RETURN TO WORK:  __________________________________________________________ 9. You should see your doctor in the office for a follow-up appointment approximately 2-3 weeks after your surgery.  Make sure that you call for this appointment within a day or two after you arrive home to insure a convenient appointment time. 10. OTHER INSTRUCTIONS:  __________________________________________________________________________________________________________________________________________________________________________________________  WHEN TO CALL YOUR DOCTOR: 1. Fever over 101.0 2. Inability to urinate 3. Nausea and/or vomiting 4. Extreme swelling or bruising 5. Continued bleeding from incision. 6. Increased pain, redness, or drainage from the incision  The clinic staff is available to answer your questions during regular business hours.  Please don't hesitate to call and ask to speak to one of the nurses for clinical concerns.  If you have a medical emergency, go to the nearest emergency room or call 911.  A surgeon from [facility] is always on call at the hospital     [address], [address], [place], [place]  [address] ?  [address] [address], [place], [place]   [address] [phone] ? [phone] ? FAX [phone] Web site:  [email or web address]  

## 2013-08-03 NOTE — Progress Notes (Signed)
Patient ID: Brendan Price, male   DOB: 20-Jul-1971, 42 y.o.   MRN: [id number]  Chief Complaint  Patient presents with  . Inguinal Hernia    HPI Brendan Price is a 42 y.o. male.   HPI  He was seen by Dr. Maisie Fus last year. He has bilateral inguinal hernias with the left side being symptomatic. He also has a small umbilical hernia. He was interested in a laparoscopic repair and now presents to discuss that. He denies any difficulty with urination or having a bowel movement. No chronic cough. 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 His CD4 count as above 700. His viral load is undetectable.  Past Medical History  Diagnosis Date  . GERD (gastroesophageal reflux disease)   . HIV infection   . Allergy     Past Surgical History  Procedure Laterality Date  . Ureteroscopy  12/1996  . Lithotripsy  2000  . Tonsillectomy  1977    Family History  Problem Relation Age of Onset  . Diabetes Mother   . Hypertension Mother   . Hypothyroidism Mother   . Diabetes Father   . Arrhythmia Father   . Benign prostatic hyperplasia Father   . Hypertension Sister   . Breast cancer Mother     Social History History  Substance Use Topics  . Smoking status: Never Smoker   . Smokeless tobacco: Never Used  . Alcohol Use: 1.0 oz/week    2 drink(s) per week     Comment: occasional    Allergies  Allergen Reactions  . Ampicillin     Current Outpatient Prescriptions  Medication Sig Dispense Refill  . aspirin 81 MG EC tablet Take 1 tablet (81 mg total) by mouth daily.  120 tablet  3  . cetirizine (ZYRTEC) 10 MG tablet Take 10 mg by mouth 2 (two) times daily.        Marland Kitchen efavirenz (SUSTIVA) 600 MG tablet Take 1 tablet (600 mg total) by mouth at bedtime.  90 tablet  6  . famotidine (PEPCID) 20 MG tablet Take 20 mg by mouth 2 (two) times daily.        Marland Kitchen lamiVUDine-zidovudine (COMBIVIR) 150-300 MG per tablet Take 1 tablet by mouth 2 (two) times daily.  180 tablet  6  . LORazepam (ATIVAN) 1 MG tablet  Take 1 mg by mouth every 8 (eight) hours as needed.        . meloxicam (MOBIC) 15 MG tablet Take 1 tablet (15 mg total) by mouth daily.  30 tablet  4  . omeprazole (PRILOSEC) 20 MG capsule Take 20 mg by mouth daily.        Marland Kitchen zolpidem (AMBIEN) 10 MG tablet Take 10 mg by mouth at bedtime as needed.         No current facility-administered medications for this visit.    Review of Systems Review of Systems  Constitutional: Negative.   HENT: Negative.   Respiratory: Negative.   Cardiovascular: Negative.   Gastrointestinal: Negative.   Genitourinary: Negative.   Hematological: Positive for adenopathy.    Blood pressure 140/72, pulse 100, temperature 97.1 F (36.2 C), temperature source Temporal, resp. rate 16, height 5\' 11"  (1.803 m), weight 169 lb (76.658 kg).  Physical Exam Physical Exam  Constitutional: He appears well-developed and well-nourished. No distress.  HENT:  Head: Normocephalic and atraumatic.  Eyes: EOM are normal. No scleral icterus.  Neck: Neck supple.  Cardiovascular: Normal rate and regular rhythm.   Pulmonary/Chest: Effort normal  and breath sounds normal.  Abdominal: Soft. He exhibits no distension and no mass. There is no tenderness.  Small, reducible umbilical bulge.  Genitourinary:  Moderate sized left inguinal bulge that is reducible. Small right inguinal bulge. No testicular masses.  Musculoskeletal: He exhibits no edema.  Some bilateral lower extremity varicosities.  Neurological: He is alert.  Skin: Skin is warm and dry.  Psychiatric: He has a normal mood and affect.    Data Reviewed Note from Dr. Maisie Fus. Note from Dr. Ninetta Lights.  Assessment    Bilateral inguinal hernias and umbilical hernia. He would like to have these repaired. He would like to have the laparoscopic technique for the inguinal hernias. HIV is well-controlled     Plan    Laparoscopic bilateral inguinal hernia repair with mesh, umbilical hernia repair with mesh.  I have  explained the procedure, risks, and aftercare of inguinal and umbilical hernia repair.  Risks include but are not limited to bleeding, infection, wound problems, anesthesia, recurrence, bladder or intestine injury, urinary retention, chronic pain, mesh problems.  He seems to understand and agrees to proceed.        Jeanni Allshouse J 08/03/2013, 10:43 AM

## 2013-08-27 ENCOUNTER — Telehealth (INDEPENDENT_AMBULATORY_CARE_PROVIDER_SITE_OTHER): Payer: Self-pay

## 2013-08-27 NOTE — Telephone Encounter (Signed)
LMOV pt to call.  Have questions regarding his RTW note.  Need to know who letter is to be addressed to.

## 2013-08-30 ENCOUNTER — Encounter (INDEPENDENT_AMBULATORY_CARE_PROVIDER_SITE_OTHER): Payer: Self-pay

## 2013-08-30 NOTE — Telephone Encounter (Signed)
Letter stating pt will be out of work at least 4 weeks after date of surgery.  Letter addressed to pt's boss, Hassell Halim, and mailed to the patient today.

## 2013-09-15 ENCOUNTER — Other Ambulatory Visit: Payer: PRIVATE HEALTH INSURANCE

## 2013-09-15 DIAGNOSIS — Z113 Encounter for screening for infections with a predominantly sexual mode of transmission: Secondary | ICD-10-CM

## 2013-09-15 DIAGNOSIS — Z79899 Other long term (current) drug therapy: Secondary | ICD-10-CM

## 2013-09-15 DIAGNOSIS — B20 Human immunodeficiency virus [HIV] disease: Secondary | ICD-10-CM

## 2013-09-15 LAB — COMPREHENSIVE METABOLIC PANEL
ALK PHOS: 76 U/L (ref 39–117)
ALT: 63 U/L — AB (ref 0–53)
AST: 37 U/L (ref 0–37)
Albumin: 4.2 g/dL (ref 3.5–5.2)
BUN: 15 mg/dL (ref 6–23)
CO2: 28 mEq/L (ref 19–32)
Calcium: 9 mg/dL (ref 8.4–10.5)
Chloride: 104 mEq/L (ref 96–112)
Creat: 0.68 mg/dL (ref 0.50–1.35)
Glucose, Bld: 94 mg/dL (ref 70–99)
POTASSIUM: 4.3 meq/L (ref 3.5–5.3)
SODIUM: 137 meq/L (ref 135–145)
TOTAL PROTEIN: 6.8 g/dL (ref 6.0–8.3)
Total Bilirubin: 0.7 mg/dL (ref 0.2–1.2)

## 2013-09-15 LAB — LIPID PANEL
Cholesterol: 142 mg/dL (ref 0–200)
HDL: 30 mg/dL — ABNORMAL LOW (ref >39)
LDL CALC: 38 mg/dL (ref 0–99)
Total CHOL/HDL Ratio: 4.7 Ratio
Triglycerides: 371 mg/dL — ABNORMAL HIGH (ref <150)
VLDL: 74 mg/dL — AB (ref 0–40)

## 2013-09-15 LAB — CBC WITH DIFFERENTIAL/PLATELET
BASOS ABS: 0.1 10*3/uL (ref 0.0–0.1)
BASOS PCT: 1 % (ref 0–1)
Eosinophils Absolute: 0.2 10*3/uL (ref 0.0–0.7)
Eosinophils Relative: 3 % (ref 0–5)
HCT: 35.5 % — ABNORMAL LOW (ref 39.0–52.0)
HEMOGLOBIN: 12.8 g/dL — AB (ref 13.0–17.0)
Lymphocytes Relative: 46 % (ref 12–46)
Lymphs Abs: 2.4 10*3/uL (ref 0.7–4.0)
MCH: 44.6 pg — ABNORMAL HIGH (ref 26.0–34.0)
MCHC: 36.1 g/dL — ABNORMAL HIGH (ref 30.0–36.0)
MCV: 123.7 fL — ABNORMAL HIGH (ref 78.0–100.0)
MONOS PCT: 11 % (ref 3–12)
Monocytes Absolute: 0.6 10*3/uL (ref 0.1–1.0)
NEUTROS ABS: 2 10*3/uL (ref 1.7–7.7)
Neutrophils Relative %: 39 % — ABNORMAL LOW (ref 43–77)
Platelets: 263 10*3/uL (ref 150–400)
RBC: 2.87 MIL/uL — ABNORMAL LOW (ref 4.22–5.81)
RDW: 13.8 % (ref 11.5–15.5)
WBC: 5.1 10*3/uL (ref 4.0–10.5)

## 2013-09-16 LAB — HIV-1 RNA QUANT-NO REFLEX-BLD

## 2013-09-16 LAB — RPR

## 2013-09-16 LAB — T-HELPER CELL (CD4) - (RCID CLINIC ONLY)
CD4 % Helper T Cell: 34 % (ref 33–55)
CD4 T CELL ABS: 830 /uL (ref 400–2700)

## 2013-09-29 ENCOUNTER — Encounter: Payer: Self-pay | Admitting: Infectious Diseases

## 2013-09-29 ENCOUNTER — Ambulatory Visit (INDEPENDENT_AMBULATORY_CARE_PROVIDER_SITE_OTHER): Payer: PRIVATE HEALTH INSURANCE | Admitting: Infectious Diseases

## 2013-09-29 VITALS — BP 123/79 | HR 74 | Temp 97.7°F | Ht 71.0 in | Wt 171.0 lb

## 2013-09-29 DIAGNOSIS — Z113 Encounter for screening for infections with a predominantly sexual mode of transmission: Secondary | ICD-10-CM

## 2013-09-29 DIAGNOSIS — K4090 Unilateral inguinal hernia, without obstruction or gangrene, not specified as recurrent: Secondary | ICD-10-CM

## 2013-09-29 DIAGNOSIS — B20 Human immunodeficiency virus [HIV] disease: Secondary | ICD-10-CM

## 2013-09-29 DIAGNOSIS — E781 Pure hyperglyceridemia: Secondary | ICD-10-CM

## 2013-09-29 NOTE — Assessment & Plan Note (Signed)
He is doing very well. Previously have considered changing him to qday regimen that would perhaps cause less lipodystrophy and he has not been interested in this. Will re-approach this with him... Will see him back in 6 months.

## 2013-09-29 NOTE — Assessment & Plan Note (Signed)
Scheduled for repair in march. Again, he has no Fhx of anesthesia rxns, his BP is normal, he has normal exercise tolerance.

## 2013-09-29 NOTE — Assessment & Plan Note (Signed)
He is working on diet. His Trig are improved. I asked if he was interested in taking fish oil but he thinks he has enough pills already. My great appreciation to Dr Laurann Montana.

## 2013-09-29 NOTE — Progress Notes (Signed)
   Subjective:    Patient ID: Brendan Price, male    DOB: 1970-10-01, 43 y.o.   MRN: [id number]  HPI 43 yo M with hx of HIV+ since 2002, lipodystrohy, anal condyloma, hernia. Has been maintained on CBV/EFV.  Has been well. Is planned for abdominal and umbilical hernia repairs March. Has been watching his diet to try and improve his triglycerides. Got flu shot at work in September.  He has no Fhx of adverse reactions to anesthesia, he has normal tolerance, BP is normal.   HIV 1 RNA Quant (copies/mL)  Date Value  09/15/2013 <20   03/29/2013 <20   09/23/2012 <20      CD4 T Cell Abs (/uL)  Date Value  09/15/2013 830   03/29/2013 730   09/23/2012 880    Lab Results  Component Value Date   CHOL 142 09/15/2013   HDL 30* 09/15/2013   LDLCALC 38 09/15/2013   TRIG 371* 09/15/2013   CHOLHDL 4.7 09/15/2013     Review of Systems     Objective:   Physical Exam  Constitutional: He appears well-developed and well-nourished.  HENT:  Mouth/Throat: No oropharyngeal exudate.  Eyes: EOM are normal. Pupils are equal, round, and reactive to light.  Neck: Neck supple.  Cardiovascular: Normal rate, regular rhythm and normal heart sounds.   Pulmonary/Chest: Effort normal and breath sounds normal.  Abdominal: Soft. Bowel sounds are normal. He exhibits no distension.  Lymphadenopathy:    He has no cervical adenopathy.          Assessment & Plan:

## 2013-10-06 ENCOUNTER — Encounter (INDEPENDENT_AMBULATORY_CARE_PROVIDER_SITE_OTHER): Payer: Self-pay | Admitting: General Surgery

## 2013-10-06 ENCOUNTER — Ambulatory Visit (INDEPENDENT_AMBULATORY_CARE_PROVIDER_SITE_OTHER): Payer: PRIVATE HEALTH INSURANCE | Admitting: General Surgery

## 2013-10-06 VITALS — BP 128/88 | HR 80 | Temp 98.3°F | Resp 16 | Ht 71.0 in | Wt 175.0 lb

## 2013-10-06 DIAGNOSIS — K4020 Bilateral inguinal hernia, without obstruction or gangrene, not specified as recurrent: Secondary | ICD-10-CM

## 2013-10-06 MED ORDER — OXYCODONE HCL 5 MG PO TABS: 5.0000 mg | ORAL_TABLET | ORAL | Status: DC | PRN

## 2013-10-06 MED ORDER — ONDANSETRON HCL 4 MG PO TABS: 4.0000 mg | ORAL_TABLET | ORAL | Status: DC | PRN

## 2013-10-06 NOTE — Progress Notes (Addendum)
Subjective:     Patient ID: Brendan Price, male   DOB: Jul 11, 1971, 43 y.o.   MRN: [id number]  HPI  He is here because he has some questions prior to his laparoscopic repair of bilateral inguinal hernias and open repair of umbilical hernia. Both of these will be repaired with mesh. He would like to make sure that his medical history remains confidential. He has some questions about activity following the surgery.   Review of SystemsHe has problems with nausea after taking narcotics and wonders if he can have an anti-emetic along with the narcotics.     Objective:   Physical Exam Gen.-thin male in no acute distress.  Cardiovascular regular rate and rhythm.  Lungs-clear. The abdomen-soft, small reducible umbilical hernia present.  GU-reducible left inguinal bulge. Small right inguinal bulge.     Assessment:     Bilateral inguinal hernias and umbilical hernia- surgery planned for early March. All questions were answered.     Plan:     We'll give him prescriptions for OxyIR Zofran today as he requests. I will see him in the holding room in early March.

## 2013-10-06 NOTE — Patient Instructions (Signed)
See instructions from previous visit

## 2013-10-07 ENCOUNTER — Encounter (HOSPITAL_COMMUNITY): Payer: Self-pay | Admitting: Pharmacy Technician

## 2013-10-12 NOTE — Pre-Procedure Instructions (Signed)
Brendan Price  10/12/2013   Your procedure is scheduled on:  Tuesday October 19, 2013 at 7:30 AM.  Report to [facility] Short Stay Entrance "A" Admitting at 5:30 AM.  Call this number if you have problems the morning of surgery: [phone]   Remember:   Do not eat food or drink liquids after midnight.   Take these medicines the morning of surgery with A SIP OF WATER: Cetirizine (Zyrtec), Famotidine (Pepcid), Flonase nasal spray, Lamivudine (Combivir), Lorazepam (Ativan) if needed for anxiety, Omeprazole (Prilosec), Ondansetron (Zofran) if needed for nausea, Oxycodone if needed for pain    Discontinue aspirin and herbal medications 7 days prior to surgery (10/13/13)   Do not wear jewelry.  Do not wear lotions, powders, or colognes. You may wear deodorant.  Men may shave face and neck.  Do not bring valuables to the hospital.  [facility] is not responsible for any belongings or valuables.               Contacts, dentures or bridgework may not be worn into surgery.  Leave suitcase in the car. After surgery it may be brought to your room.  For patients admitted to the hospital, discharge time is determined by your treatment team.               Patients discharged the day of surgery will not be allowed to drive home.  Name and phone number of your driver: Family/Friend  Special Instructions: Shower the night before and the morning of your surgery using CHG soap   Please read over the following fact sheets that you were given: Pain Booklet, Coughing and Deep Breathing and Surgical Site Infection Prevention

## 2013-10-13 ENCOUNTER — Encounter (HOSPITAL_COMMUNITY)
Admission: RE | Admit: 2013-10-13 | Discharge: 2013-10-13 | Disposition: A | Discharge status: Home / Self Care | Payer: PRIVATE HEALTH INSURANCE | Source: Ambulatory Visit | Attending: General Surgery | Admitting: General Surgery

## 2013-10-13 ENCOUNTER — Encounter (HOSPITAL_COMMUNITY): Payer: Self-pay

## 2013-10-13 DIAGNOSIS — Z01812 Encounter for preprocedural laboratory examination: Secondary | ICD-10-CM | POA: Insufficient documentation

## 2013-10-13 HISTORY — DX: Other seasonal allergic rhinitis: J30.2

## 2013-10-13 HISTORY — DX: Calculus of kidney: N20.0

## 2013-10-13 LAB — COMPREHENSIVE METABOLIC PANEL
ALK PHOS: 85 U/L (ref 39–117)
ALT: 65 U/L — ABNORMAL HIGH (ref 0–53)
AST: 42 U/L — ABNORMAL HIGH (ref 0–37)
Albumin: 4.2 g/dL (ref 3.5–5.2)
BILIRUBIN TOTAL: 0.7 mg/dL (ref 0.3–1.2)
BUN: 17 mg/dL (ref 6–23)
CHLORIDE: 102 meq/L (ref 96–112)
CO2: 29 mEq/L (ref 19–32)
CREATININE: 0.69 mg/dL (ref 0.50–1.35)
Calcium: 9.6 mg/dL (ref 8.4–10.5)
GFR calc non Af Amer: >90 mL/min (ref >90)
GLUCOSE: 103 mg/dL — AB (ref 70–99)
POTASSIUM: 4.4 meq/L (ref 3.7–5.3)
Sodium: 142 mEq/L (ref 137–147)
Total Protein: 7.7 g/dL (ref 6.0–8.3)

## 2013-10-13 LAB — CBC
HCT: 36.6 % — ABNORMAL LOW (ref 39.0–52.0)
HEMOGLOBIN: 13.5 g/dL (ref 13.0–17.0)
MCH: 45.6 pg — ABNORMAL HIGH (ref 26.0–34.0)
MCHC: 36.9 g/dL — ABNORMAL HIGH (ref 30.0–36.0)
MCV: 123.6 fL — AB (ref 78.0–100.0)
Platelets: 262 10*3/uL (ref 150–400)
RBC: 2.96 MIL/uL — ABNORMAL LOW (ref 4.22–5.81)
RDW: 13 % (ref 11.5–15.5)
WBC: 5.8 10*3/uL (ref 4.0–10.5)

## 2013-10-13 LAB — PROTIME-INR
INR: 1.02 (ref 0.00–1.49)
PROTHROMBIN TIME: 13.2 s (ref 11.6–15.2)

## 2013-10-13 NOTE — Progress Notes (Signed)
Patient denied having any cardiac or pulmonary issues. PCP is Dr. Lavone Orn and patient sees Dr. Bobby Rumpf for infectious disease. During PAT visit patient informed Nurse that his family (mother and sister) is NOT aware of his HIV diagnosis and would perfer that they not know. However his friend that will be present on [place] is aware of diagnosis and it is OK to ask any questions about it in front of him.

## 2013-10-18 ENCOUNTER — Other Ambulatory Visit: Payer: Self-pay | Admitting: Infectious Diseases

## 2013-10-18 ENCOUNTER — Other Ambulatory Visit: Payer: Self-pay | Admitting: *Deleted

## 2013-10-18 DIAGNOSIS — B20 Human immunodeficiency virus [HIV] disease: Secondary | ICD-10-CM

## 2013-10-18 MED ORDER — LAMIVUDINE-ZIDOVUDINE 150-300 MG PO TABS: 1.0000 | ORAL_TABLET | Freq: Two times a day (BID) | ORAL | Status: DC

## 2013-10-18 MED ORDER — EFAVIRENZ 600 MG PO TABS: 600.0000 mg | ORAL_TABLET | Freq: Every day | ORAL | Status: DC

## 2013-10-18 MED ORDER — LAMIVUDINE-ZIDOVUDINE 150-300 MG PO TABS
1.0000 | ORAL_TABLET | Freq: Two times a day (BID) | ORAL | Status: DC
Start: 2013-10-18 — End: 2014-11-17

## 2013-10-19 ENCOUNTER — Encounter (HOSPITAL_COMMUNITY)
Admission: RE | Disposition: A | Discharge status: Home / Self Care | Payer: Self-pay | Source: Ambulatory Visit | Attending: General Surgery

## 2013-10-19 ENCOUNTER — Ambulatory Visit (HOSPITAL_COMMUNITY)
Admission: RE | Admit: 2013-10-19 | Discharge: 2013-10-19 | Disposition: A | Discharge status: Home / Self Care | Payer: PRIVATE HEALTH INSURANCE | Source: Ambulatory Visit | Attending: General Surgery | Admitting: General Surgery

## 2013-10-19 ENCOUNTER — Encounter (HOSPITAL_COMMUNITY): Payer: Self-pay | Admitting: *Deleted

## 2013-10-19 ENCOUNTER — Ambulatory Visit (HOSPITAL_COMMUNITY): Payer: PRIVATE HEALTH INSURANCE | Admitting: Anesthesiology

## 2013-10-19 ENCOUNTER — Encounter (HOSPITAL_COMMUNITY): Payer: PRIVATE HEALTH INSURANCE | Admitting: Anesthesiology

## 2013-10-19 DIAGNOSIS — K219 Gastro-esophageal reflux disease without esophagitis: Secondary | ICD-10-CM | POA: Insufficient documentation

## 2013-10-19 DIAGNOSIS — K429 Umbilical hernia without obstruction or gangrene: Secondary | ICD-10-CM

## 2013-10-19 DIAGNOSIS — K4020 Bilateral inguinal hernia, without obstruction or gangrene, not specified as recurrent: Secondary | ICD-10-CM

## 2013-10-19 DIAGNOSIS — F411 Generalized anxiety disorder: Secondary | ICD-10-CM | POA: Insufficient documentation

## 2013-10-19 DIAGNOSIS — Z21 Asymptomatic human immunodeficiency virus [HIV] infection status: Secondary | ICD-10-CM | POA: Insufficient documentation

## 2013-10-19 DIAGNOSIS — K759 Inflammatory liver disease, unspecified: Secondary | ICD-10-CM | POA: Insufficient documentation

## 2013-10-19 HISTORY — PX: INGUINAL HERNIA REPAIR: SHX194

## 2013-10-19 HISTORY — PX: INSERTION OF MESH: SHX5868

## 2013-10-19 HISTORY — PX: HERNIA REPAIR: SHX51

## 2013-10-19 SURGERY — REPAIR, HERNIA, INGUINAL, BILATERAL, LAPAROSCOPIC
Anesthesia: General | Site: Groin

## 2013-10-19 MED ORDER — PHENYLEPHRINE 40 MCG/ML (10ML) SYRINGE FOR IV PUSH (FOR BLOOD PRESSURE SUPPORT)
PREFILLED_SYRINGE | INTRAVENOUS | Status: AC
  Filled 2013-10-19: qty 10

## 2013-10-19 MED ORDER — PROPOFOL 10 MG/ML IV BOLUS
INTRAVENOUS | Status: AC
Start: 2013-10-19 — End: 2013-10-19
  Filled 2013-10-19: qty 20

## 2013-10-19 MED ORDER — PROPOFOL 10 MG/ML IV BOLUS
INTRAVENOUS | Status: DC | PRN
  Administered 2013-10-19: 200 mg via INTRAVENOUS
  Administered 2013-10-19: 20 mg via INTRAVENOUS

## 2013-10-19 MED ORDER — HYDROMORPHONE HCL PF 1 MG/ML IJ SOLN
0.2500 mg | INTRAMUSCULAR | Status: DC | PRN
  Administered 2013-10-19 (×2): 0.5 mg via INTRAVENOUS

## 2013-10-19 MED ORDER — PHENYLEPHRINE HCL 10 MG/ML IJ SOLN
INTRAMUSCULAR | Status: DC | PRN
  Administered 2013-10-19 (×2): 40 ug via INTRAVENOUS

## 2013-10-19 MED ORDER — MIDAZOLAM HCL 2 MG/2ML IJ SOLN
INTRAMUSCULAR | Status: AC
  Filled 2013-10-19: qty 2

## 2013-10-19 MED ORDER — DEXAMETHASONE SODIUM PHOSPHATE 4 MG/ML IJ SOLN
INTRAMUSCULAR | Status: DC | PRN
  Administered 2013-10-19: 8 mg via INTRAVENOUS

## 2013-10-19 MED ORDER — FENTANYL CITRATE 0.05 MG/ML IJ SOLN
INTRAMUSCULAR | Status: AC
  Filled 2013-10-19: qty 5

## 2013-10-19 MED ORDER — METOCLOPRAMIDE HCL 5 MG/ML IJ SOLN: 10.0000 mg | Freq: Once | INTRAMUSCULAR | Status: DC | PRN

## 2013-10-19 MED ORDER — PROMETHAZINE HCL 12.5 MG PO TABS: 25.0000 mg | ORAL_TABLET | ORAL | Status: DC | PRN

## 2013-10-19 MED ORDER — ONDANSETRON HCL 4 MG/2ML IJ SOLN
INTRAMUSCULAR | Status: AC
  Filled 2013-10-19: qty 2

## 2013-10-19 MED ORDER — OXYCODONE HCL 5 MG/5ML PO SOLN: 5.0000 mg | Freq: Once | ORAL | Status: AC | PRN

## 2013-10-19 MED ORDER — ONDANSETRON HCL 4 MG/2ML IJ SOLN
INTRAMUSCULAR | Status: DC | PRN
  Administered 2013-10-19: 4 mg via INTRAVENOUS

## 2013-10-19 MED ORDER — METOCLOPRAMIDE HCL 5 MG/ML IJ SOLN
INTRAMUSCULAR | Status: DC | PRN
  Administered 2013-10-19: 10 mg via INTRAVENOUS

## 2013-10-19 MED ORDER — OXYCODONE HCL 5 MG PO TABS
ORAL_TABLET | ORAL | Status: AC
  Filled 2013-10-19: qty 1

## 2013-10-19 MED ORDER — NEOSTIGMINE METHYLSULFATE 1 MG/ML IJ SOLN
INTRAMUSCULAR | Status: AC
  Filled 2013-10-19: qty 10

## 2013-10-19 MED ORDER — FENTANYL CITRATE 0.05 MG/ML IJ SOLN
INTRAMUSCULAR | Status: DC | PRN
  Administered 2013-10-19 (×6): 50 ug via INTRAVENOUS
  Administered 2013-10-19: 100 ug via INTRAVENOUS

## 2013-10-19 MED ORDER — GLYCOPYRROLATE 0.2 MG/ML IJ SOLN
INTRAMUSCULAR | Status: DC | PRN
  Administered 2013-10-19: 0.3 mg via INTRAVENOUS

## 2013-10-19 MED ORDER — ROCURONIUM BROMIDE 100 MG/10ML IV SOLN
INTRAVENOUS | Status: DC | PRN
  Administered 2013-10-19: 50 mg via INTRAVENOUS

## 2013-10-19 MED ORDER — PROPOFOL 10 MG/ML IV BOLUS
INTRAVENOUS | Status: AC
  Filled 2013-10-19: qty 20

## 2013-10-19 MED ORDER — 0.9 % SODIUM CHLORIDE (POUR BTL) OPTIME
TOPICAL | Status: DC | PRN
  Administered 2013-10-19: 1000 mL

## 2013-10-19 MED ORDER — DEXAMETHASONE SODIUM PHOSPHATE 4 MG/ML IJ SOLN
INTRAMUSCULAR | Status: AC
  Filled 2013-10-19: qty 2

## 2013-10-19 MED ORDER — BUPIVACAINE-EPINEPHRINE (PF) 0.5% -1:200000 IJ SOLN
INTRAMUSCULAR | Status: AC
Start: 2013-10-19 — End: 2013-10-19
  Filled 2013-10-19: qty 10

## 2013-10-19 MED ORDER — METOCLOPRAMIDE HCL 5 MG/ML IJ SOLN
INTRAMUSCULAR | Status: AC
  Filled 2013-10-19: qty 2

## 2013-10-19 MED ORDER — OXYCODONE HCL 5 MG PO TABS
5.0000 mg | ORAL_TABLET | Freq: Once | ORAL | Status: AC | PRN
  Administered 2013-10-19: 5 mg via ORAL

## 2013-10-19 MED ORDER — MIDAZOLAM HCL 5 MG/5ML IJ SOLN
INTRAMUSCULAR | Status: DC | PRN
  Administered 2013-10-19: 2 mg via INTRAVENOUS

## 2013-10-19 MED ORDER — HYDROMORPHONE HCL PF 1 MG/ML IJ SOLN
INTRAMUSCULAR | Status: AC
  Filled 2013-10-19: qty 1

## 2013-10-19 MED ORDER — GLYCOPYRROLATE 0.2 MG/ML IJ SOLN
INTRAMUSCULAR | Status: AC
  Filled 2013-10-19: qty 2

## 2013-10-19 MED ORDER — LIDOCAINE HCL (CARDIAC) 20 MG/ML IV SOLN
INTRAVENOUS | Status: AC
  Filled 2013-10-19: qty 5

## 2013-10-19 MED ORDER — SODIUM CHLORIDE 0.9 % IR SOLN
Status: DC | PRN
  Administered 2013-10-19: 1000 mL

## 2013-10-19 MED ORDER — CEFAZOLIN SODIUM-DEXTROSE 2-3 GM-% IV SOLR
2.0000 g | Freq: Once | INTRAVENOUS | Status: AC
  Administered 2013-10-19: 2 g via INTRAVENOUS
  Filled 2013-10-19: qty 50

## 2013-10-19 MED ORDER — NEOSTIGMINE METHYLSULFATE 1 MG/ML IJ SOLN
INTRAMUSCULAR | Status: DC | PRN
  Administered 2013-10-19: 2.5 mg via INTRAVENOUS

## 2013-10-19 MED ORDER — LACTATED RINGERS IV SOLN
INTRAVENOUS | Status: DC | PRN
  Administered 2013-10-19 (×2): via INTRAVENOUS

## 2013-10-19 MED ORDER — LIDOCAINE HCL (CARDIAC) 20 MG/ML IV SOLN
INTRAVENOUS | Status: DC | PRN
  Administered 2013-10-19: 100 mg via INTRAVENOUS

## 2013-10-19 MED ORDER — BUPIVACAINE-EPINEPHRINE 0.5% -1:200000 IJ SOLN
INTRAMUSCULAR | Status: DC | PRN
  Administered 2013-10-19: 17 mL

## 2013-10-19 SURGICAL SUPPLY — 57 items
APL SKNCLS STERI-STRIP NONHPOA (GAUZE/BANDAGES/DRESSINGS) ×2
APPLIER CLIP LOGIC TI 5 (MISCELLANEOUS) IMPLANT
APR CLP MED LRG 33X5 (MISCELLANEOUS)
BENZOIN TINCTURE PRP APPL 2/3 (GAUZE/BANDAGES/DRESSINGS) ×4 IMPLANT
CANISTER SUCTION 2500CC (MISCELLANEOUS) ×3 IMPLANT
CHLORAPREP W/TINT 26ML (MISCELLANEOUS) ×4 IMPLANT
COVER SURGICAL LIGHT HANDLE (MISCELLANEOUS) ×4 IMPLANT
DECANTER SPIKE VIAL GLASS SM (MISCELLANEOUS) ×1 IMPLANT
DEVICE SECURE STRAP 25 ABSORB (INSTRUMENTS) ×2 IMPLANT
DISSECT BALLN SPACEMKR + OVL (BALLOONS) ×4
DISSECTOR BALLN SPACEMKR + OVL (BALLOONS) ×2 IMPLANT
DISSECTOR BLUNT TIP ENDO 5MM (MISCELLANEOUS) ×4 IMPLANT
DRAPE UTILITY 15X26 W/TAPE STR (DRAPE) ×8 IMPLANT
DRSG TEGADERM 4X4.75 (GAUZE/BANDAGES/DRESSINGS) ×4 IMPLANT
ELECT CAUTERY BLADE 6.4 (BLADE) ×3 IMPLANT
ELECT REM PT RETURN 9FT ADLT (ELECTROSURGICAL) ×4
ELECTRODE REM PT RTRN 9FT ADLT (ELECTROSURGICAL) ×2 IMPLANT
GAUZE SPONGE 2X2 8PLY STRL LF (GAUZE/BANDAGES/DRESSINGS) ×2 IMPLANT
GLOVE BIO SURGEON STRL SZ7.5 (GLOVE) ×2 IMPLANT
GLOVE BIOGEL PI IND STRL 6.5 (GLOVE) IMPLANT
GLOVE BIOGEL PI IND STRL 7.0 (GLOVE) IMPLANT
GLOVE BIOGEL PI IND STRL 7.5 (GLOVE) ×1 IMPLANT
GLOVE BIOGEL PI IND STRL 8 (GLOVE) ×2 IMPLANT
GLOVE BIOGEL PI INDICATOR 6.5 (GLOVE) ×4
GLOVE BIOGEL PI INDICATOR 7.0 (GLOVE) ×4
GLOVE BIOGEL PI INDICATOR 7.5 (GLOVE) ×2
GLOVE BIOGEL PI INDICATOR 8 (GLOVE) ×2
GLOVE ECLIPSE 8.0 STRL XLNG CF (GLOVE) ×4 IMPLANT
GLOVE SURG SS PI 6.5 STRL IVOR (GLOVE) ×3 IMPLANT
GLOVE SURG SS PI 7.0 STRL IVOR (GLOVE) ×4 IMPLANT
GOWN STRL NON-REIN LRG LVL3 (GOWN DISPOSABLE) ×15 IMPLANT
KIT BASIN OR (CUSTOM PROCEDURE TRAY) ×4 IMPLANT
KIT ROOM TURNOVER OR (KITS) ×4 IMPLANT
MESH HERNIA 3X6 (Mesh General) ×2 IMPLANT
MESH HERNIA 6X6 BARD (Mesh General) IMPLANT
MESH HERNIA BARD 6X6 (Mesh General) ×4 IMPLANT
NS IRRIG 1000ML POUR BTL (IV SOLUTION) ×4 IMPLANT
PAD ARMBOARD 7.5X6 YLW CONV (MISCELLANEOUS) ×8 IMPLANT
PENCIL BUTTON HOLSTER BLD 10FT (ELECTRODE) ×2 IMPLANT
SET IRRIG TUBING LAPAROSCOPIC (IRRIGATION / IRRIGATOR) ×3 IMPLANT
SET TROCAR LAP APPLE-HUNT 5MM (ENDOMECHANICALS) ×4 IMPLANT
SPONGE GAUZE 2X2 STER 10/PKG (GAUZE/BANDAGES/DRESSINGS) ×2
SUT MNCRL AB 4-0 PS2 18 (SUTURE) ×4 IMPLANT
SUT PROLENE 0 SH 30 (SUTURE) ×2 IMPLANT
SUT SURG 0 T 19/GS 22 1969 62 (SUTURE) ×3 IMPLANT
SUT VIC AB 2-0 CT1 36 (SUTURE) ×4 IMPLANT
SUT VIC AB 3-0 SH 27 (SUTURE) ×4
SUT VIC AB 3-0 SH 27XBRD (SUTURE) ×1 IMPLANT
TACKER 5MM HERNIA 3.5CML NAB (ENDOMECHANICALS) ×2 IMPLANT
TOWEL OR 17X24 6PK STRL BLUE (TOWEL DISPOSABLE) ×4 IMPLANT
TOWEL OR 17X26 10 PK STRL BLUE (TOWEL DISPOSABLE) ×4 IMPLANT
TOWEL OR NON WOVEN STRL DISP B (DISPOSABLE) ×3 IMPLANT
TRAY FOLEY CATH 16FRSI W/METER (SET/KITS/TRAYS/PACK) IMPLANT
TRAY LAPAROSCOPIC (CUSTOM PROCEDURE TRAY) ×4 IMPLANT
TUBE CONNECTING 12'X1/4 (SUCTIONS) ×1
TUBE CONNECTING 12X1/4 (SUCTIONS) ×2 IMPLANT
YANKAUER SUCT BULB TIP NO VENT (SUCTIONS) ×2 IMPLANT

## 2013-10-19 NOTE — Discharge Instructions (Signed)
[facility] _______[facility]  UMBILICAL OR INGUINAL HERNIA REPAIR: POST OP INSTRUCTIONS  Always review your discharge instruction sheet given to you by the facility where your surgery was performed. IF YOU HAVE DISABILITY OR FAMILY LEAVE FORMS, YOU MUST BRING THEM TO THE OFFICE FOR PROCESSING.   DO NOT GIVE THEM TO YOUR DOCTOR.  1. A  prescription for pain medication may be given to you upon discharge.  Take your pain medication as prescribed, if needed.  If narcotic pain medicine is not needed, then you may take acetaminophen (Tylenol) or ibuprofen (Advil) as needed. 2. Take your usually prescribed medications unless otherwise directed. 3. If you need a refill on your pain medication, please contact your pharmacy.  They will contact our office to request authorization. Prescriptions will not be filled after 5 pm or on week-ends. 4. You should follow a light diet the first 24 hours after arrival home, such as soup and crackers, etc.  Be sure to include lots of fluids daily.  Resume your normal diet the day after surgery. 5. Most patients will experience some swelling and bruising around the umbilicus or in the groin and scrotum.  Ice packs and reclining will help.  Swelling and bruising can take several days to resolve.  6. It is common to experience some constipation if taking pain medication after surgery.  Increasing fluid intake and taking a stool softener (such as Colace) will usually help or prevent this problem from occurring.  A mild laxative (Milk of Magnesia or Miralax) should be taken according to package directions if there are no bowel movements after 48 hours. 7. Unless discharge instructions indicate otherwise, you may remove your bandages 72 hours after surgery, and you may shower at that time.  You may have steri-strips (small skin tapes) in place directly over the incision.  These strips should be left on the skin for 14 days.  If your surgeon used skin glue on the  incision, you may shower in 24 hours.  The glue will flake off over the next 2-3 weeks.  Any sutures or staples will be removed at the office during your follow-up visit. 8. ACTIVITIES:  You may resume regular (light) daily activities beginning the next day--such as daily self-care, walking, climbing stairs--gradually increasing activities as tolerated.  You may have sexual intercourse when it is comfortable.  Refrain from any heavy lifting or straining-nothing over 10 pounds for 6 weeks.  a. You may drive when you are no longer taking prescription pain medication, you can comfortably wear a seatbelt, and you can safely maneuver your car and apply brakes. b. RETURN TO WORK:  _Light work in 1-2 weeks._________________________________________________________ 9. You should see your doctor in the office for a follow-up appointment approximately 2-3 weeks after your surgery.  Make sure that you call for this appointment within a day or two after you arrive home to insure a convenient appointment time. 10. OTHER INSTRUCTIONS:  __________________________________________________________________________________________________________________________________________________________________________________________  WHEN TO CALL YOUR DOCTOR: 1. Fever over 101.0 2. Inability to urinate 3. Nausea and/or vomiting 4. Extreme swelling or bruising 5. Continued bleeding from incision. 6. Increased pain, redness, or drainage from the incision  The clinic staff is available to answer your questions during regular business hours.  Please dont hesitate to call and ask to speak to one of the nurses for clinical concerns.  If you have a medical emergency, go to the nearest emergency room or call 911.  A surgeon from [facility] is always on  call at the hospital   [address] [place], [place], [place]  [address] ?  [address], [place], [place]   [address] [phone] ? [phone] ? FAX [phone]  [phone] Web site: [email or web address]

## 2013-10-19 NOTE — Interval H&P Note (Signed)
History and Physical Interval Note:  10/19/2013 7:32 AM  Brendan Price  has presented today for surgery, with the diagnosis of Bilateral inguinal hernias. Umbilical hernia.  The various methods of treatment have been discussed with the patient and family. After consideration of risks, benefits and other options for treatment, the patient has consented to  Procedure(s): [place] (Bilateral) INSERTION OF MESH (N/A) as a surgical intervention .  The patient's history has been reviewed, patient examined, no change in status, stable for surgery.  I have reviewed the patient's chart and labs.  Questions were answered to the patient's satisfaction.     [name] Lenna Sciara

## 2013-10-19 NOTE — H&P (View-Only) (Signed)
Subjective:     Patient ID: Brendan Price, male   DOB: 08-12-1971, 43 y.o.   MRN: [id number]  HPI  She is here because he has some questions prior to his laparoscopic repair of bilateral inguinal hernias and open repair of umbilical hernia. Both of these will be repaired with mesh. He would like to make sure that his medical history remains confidential. He has some questions about activity following the surgery.   Review of SystemsHe has problems with nausea after taking narcotics and wonders if he can have an anti-emetic along with the narcotics.     Objective:   Physical Exam Gen.-thin male in no acute distress.  Cardiovascular regular rate and rhythm.  Lungs-clear. The abdomen-soft, small reducible umbilical hernia present.  GU-reducible left inguinal bulge. Small right inguinal bulge.     Assessment:     Bilateral inguinal hernias and umbilical hernia- surgery planned for early March. All questions were answered.     Plan:     We'll give him prescriptions for OxyIR Zofran today as she requests. I will see him in the holding room in early March.

## 2013-10-19 NOTE — Preoperative (Signed)
Beta Blockers   Reason not to administer Beta Blockers:Not Applicable 

## 2013-10-19 NOTE — Anesthesia Postprocedure Evaluation (Signed)
Anesthesia Post Note  Patient: Brendan Price  Procedure(s) Performed: Procedure(s) (LRB): LAPAROSCOPIC BILATERAL INGUINAL HERNIA REPAIR WITH UMBILICAL HERNIA REPAIR (N/A) INSERTION OF MESH (N/A)  Anesthesia type: General  Patient location: PACU  Post pain: Pain level controlled  Post assessment: Patient's Cardiovascular Status Stable  Last Vitals:  Filed Vitals:   10/19/13 1059  BP: 122/85  Pulse: 83  Temp:   Resp: 20    Post vital signs: Reviewed and stable  Level of consciousness: alert  Complications: No apparent anesthesia complications

## 2013-10-19 NOTE — Anesthesia Preprocedure Evaluation (Addendum)
Anesthesia Evaluation  Patient identified by MRN, date of birth, ID band Patient awake    Reviewed: Allergy & Precautions, H&P , NPO status , Patient's Chart, lab work & pertinent test results, reviewed documented beta blocker date and time   History of Anesthesia Complications Negative for: history of anesthetic complications  Airway Mallampati: II TM Distance: >3 FB Neck ROM: full    Dental  (+) Teeth Intact, Dental Advisory Given   Pulmonary neg pulmonary ROS,  breath sounds clear to auscultation        Cardiovascular negative cardio ROS  Rhythm:regular     Neuro/Psych PSYCHIATRIC DISORDERS Anxiety negative neurological ROS     GI/Hepatic negative GI ROS, GERD-  Medicated and Controlled,(+) Hepatitis -, Cytomegalovirus  Endo/Other  negative endocrine ROS  Renal/GU Renal diseaseKidney stones.  negative genitourinary   Musculoskeletal   Abdominal   Peds  Hematology  (+) HIV,   Anesthesia Other Findings See surgeon's H&P   Reproductive/Obstetrics negative OB ROS                          Anesthesia Physical Anesthesia Plan  ASA: II  Anesthesia Plan: General   Post-op Pain Management:    Induction: Intravenous  Airway Management Planned: Oral ETT  Additional Equipment:   Intra-op Plan:   Post-operative Plan: Extubation in OR  Informed Consent: I have reviewed the patients History and Physical, chart, labs and discussed the procedure including the risks, benefits and alternatives for the proposed anesthesia with the patient or authorized representative who has indicated his/her understanding and acceptance.   Dental Advisory Given  Plan Discussed with: CRNA and Surgeon  Anesthesia Plan Comments:        Anesthesia Quick Evaluation

## 2013-10-19 NOTE — Anesthesia Procedure Notes (Signed)
Procedure Name: Intubation Date/Time: 10/19/2013 7:41 AM Performed by: Jenne Campus Pre-anesthesia Checklist: Patient identified, Emergency Drugs available, Suction available, Patient being monitored and Timeout performed Patient Re-evaluated:Patient Re-evaluated prior to inductionOxygen Delivery Method: Circle system utilized Preoxygenation: Pre-oxygenation with 100% oxygen Intubation Type: IV induction Ventilation: Mask ventilation without difficulty and Oral airway inserted - appropriate to patient size Laryngoscope Size: Miller and 2 Grade View: Grade II Tube type: Oral Tube size: 7.5 mm Number of attempts: 1 Airway Equipment and Method: Stylet Placement Confirmation: ETT inserted through vocal cords under direct vision,  positive ETCO2,  CO2 detector and breath sounds checked- equal and bilateral Secured at: 22 cm Tube secured with: Tape Dental Injury: Teeth and Oropharynx as per pre-operative assessment

## 2013-10-19 NOTE — Op Note (Signed)
Operative Note  Brendan Price male 43 y.o. 10/19/2013  PREOPERATIVE DX:  Bilateral inguinal hernias, umbilical hernia  POSTOPERATIVE DX:  Same  PROCEDURE:  Laparoscopic repair of bilateral inguinal hernias with mesh. Open repair of umbilical hernia  with mesh.         Surgeon: Odis Hollingshead   Assistants: None  Anesthesia: General endotracheal anesthesia  Indications: This is a 43 year old male with symptomatic inguinal hernias. He also has an umbilical hernia. He now presents for repair.    Procedure Detail:  He was seen in the holding area. He is brought to the operative room placed supine on the operating table and a general anesthetic was given. The hair in the abdominal wall and groin was clipped as needed. These areas were then sterilely prepped and draped.  Marcaine was infiltrated in the subumbilical and periumbilical regions. A transverse subumbilical incision was made through the skin and subcutaneous tissue. Using blunt dissection the right anterior rectus sheath was identified and a small incision made in it. The underlying right rectus muscle was swept laterally exposing the posterior rectus sheath. A balloon dissection device was then placed in extraperineal space. Under laparoscopic vision blunt dissection was performed. The balloon was then removed and CO2 gas was insufflated creating a working space. Two 5 mm trocars were then placed in the lower midline.  Using blunt dissection Cooper's ligament was identified bilaterally. Beginning on the right side I dissected tissue from the abdominal wall anteriorly and laterally to the level of the umbilicus. An indirect hernia was noted and I reduced the extraperitoneal fat up in it. The spermatic cord was isolated and a window was created around it.  The peritoneal sac was then dissected back to the level of the umbilicus.  On the left side a direct hernia sac was noted and extraperitoneal fat was dissected free from the  hernia. I then dissected tissue free from the anterior and lateral abdominal walls back to the level of the umbilicus. The spermatic cord was isolated and a window was created around it. Thus he had an indirect hernia on the right side, and a direct hernia on the left side.  Two pieces of 6 x 6 polypropylene mesh were brought into the field. 2 cm were cut off each piece of mesh and a partial longitudinal split was then cut into it. The first piece was then placed into the right extraperitoneal space. Two tails were wrapped around the spermatic cord. The mesh was anchored to Cooper's ligament, the anterior abdominal wall, and lateral, while using spiral tacks. A second place was placed in the left extraperitoneal space and deployed. 2 tails of mesh were wrapped around the spermatic cord. The mesh was anchored to Cooper's ligament, the anterior abdominal wall, and lateral abdominal wall spiral tacks. There is some laxity of the mesh. This provided coverage for the direct space, indirect space, and femoral space with good overlap on both sides.  The area was then inspected and there is no evidence of bleeding or organ injury. The CO2 gas was released and the extraperitoneal contents were observed as they approximated the mesh. All trocars were then removed.  Next, the umbilical area was approached. I dissected the umbilical stalk free from the fascia and expose the underlying hernia defect. I then dissected the subcutaneous tissue free from the fascia a 34 cm around the primary defect. The primary defect was then closed with 0 Surgilon sutures. The right anterior rectus sheath defect was closed with interrupted  0 Vicryl sutures. The primary defect closure sutures were left long. A piece of polypropylene mesh was brought onto the field and cut so there be 3-4 cm overlap in all directions. The primary sutures were then threaded up through the mesh and tied down anchoring the mesh directly over the primary repair of  the umbilical hernia. The periphery of the mesh was anchored to the fascia with a running 0 Prolene suture. The wound was inspected and hemostasis was adequate.  There umbilicus was reimplanted on the mesh with 3-0 Vicryl suture. The subcutaneous tissue was closed over the mesh with a running 3-0 Vicryl suture. The skin of all incisions was closed with 4-0 Monocryl subcuticular stitches. Steri-Strips and sterile dressings were applied.  He tolerated the procedures well without any apparent complications and was taken to the recovery room in satisfactory condition.  Estimated Blood Loss:  less than 100 mL         Drains: none  Blood Given: none          Specimens: none        Complications:  * No complications entered in OR log *         Disposition: PACU - hemodynamically stable.         Condition: stable

## 2013-10-19 NOTE — Transfer of Care (Signed)
Immediate Anesthesia Transfer of Care Note  Patient: Brendan Price  Procedure(s) Performed: Procedure(s) with comments: LAPAROSCOPIC BILATERAL INGUINAL HERNIA REPAIR WITH UMBILICAL HERNIA REPAIR (N/A) INSERTION OF MESH (N/A) - Mesh to bilateral groin and abdomen  Patient Location: PACU  Anesthesia Type:General  Level of Consciousness: awake, alert , oriented and patient cooperative  Airway & Oxygen Therapy: Patient Spontanous Breathing and Patient connected to nasal cannula oxygen  Post-op Assessment: Report given to PACU RN and Post -op Vital signs reviewed and stable  Post vital signs: Reviewed  Complications: No apparent anesthesia complications

## 2013-10-20 ENCOUNTER — Encounter (HOSPITAL_COMMUNITY): Payer: Self-pay | Admitting: General Surgery

## 2013-11-08 ENCOUNTER — Ambulatory Visit (INDEPENDENT_AMBULATORY_CARE_PROVIDER_SITE_OTHER): Payer: PRIVATE HEALTH INSURANCE | Admitting: General Surgery

## 2013-11-08 ENCOUNTER — Encounter (INDEPENDENT_AMBULATORY_CARE_PROVIDER_SITE_OTHER): Payer: Self-pay | Admitting: General Surgery

## 2013-11-08 VITALS — BP 124/88 | HR 80 | Temp 98.2°F | Resp 16 | Ht 71.0 in | Wt 175.0 lb

## 2013-11-08 DIAGNOSIS — Z4889 Encounter for other specified surgical aftercare: Secondary | ICD-10-CM

## 2013-11-08 NOTE — Patient Instructions (Signed)
Continue light activities for now. 6 weeks from the date of surgery, may resume normal activities as tolerated, as discussed.

## 2013-11-08 NOTE — Progress Notes (Signed)
He presents for postop followup after laparoscopic bilateral inguinal hernia repair with mesh and open umbilical hernia repair with mesh.  He has some swelling in the periumbilical area. He's been having some discomfort in the right groin and right testicle areas.  P.E.  ABD:  Soft, incisions clean/dry/intact,Mild swelling in the periumbilical area with firmness.  GU:No swelling in the right inguinal area, mild swelling in the right inguinal area.  Assessment:  Doing well post repair of hernias.  Plan:  Continue light activities for 6 weeks postop then slowly start to resume normal activities.  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 Return visit as needed.

## 2013-11-13 ENCOUNTER — Other Ambulatory Visit: Payer: Self-pay | Admitting: Infectious Diseases

## 2013-12-21 ENCOUNTER — Other Ambulatory Visit: Payer: Self-pay | Admitting: Licensed Clinical Social Worker

## 2013-12-21 DIAGNOSIS — M797 Fibromyalgia: Secondary | ICD-10-CM

## 2013-12-21 MED ORDER — MELOXICAM 15 MG PO TABS: 15.0000 mg | ORAL_TABLET | Freq: Every day | ORAL | Status: DC

## 2014-02-13 ENCOUNTER — Other Ambulatory Visit: Payer: Self-pay | Admitting: Infectious Diseases

## 2014-05-23 ENCOUNTER — Other Ambulatory Visit: Payer: Self-pay | Admitting: Infectious Diseases

## 2014-05-25 ENCOUNTER — Ambulatory Visit: Payer: PRIVATE HEALTH INSURANCE | Admitting: Infectious Diseases

## 2014-05-30 ENCOUNTER — Encounter: Payer: Self-pay | Admitting: Infectious Diseases

## 2014-05-30 ENCOUNTER — Ambulatory Visit (INDEPENDENT_AMBULATORY_CARE_PROVIDER_SITE_OTHER): Payer: PRIVATE HEALTH INSURANCE | Admitting: Infectious Diseases

## 2014-05-30 VITALS — BP 124/86 | HR 101 | Temp 98.2°F | Ht 71.5 in | Wt 174.0 lb

## 2014-05-30 DIAGNOSIS — B20 Human immunodeficiency virus [HIV] disease: Secondary | ICD-10-CM

## 2014-05-30 DIAGNOSIS — Z113 Encounter for screening for infections with a predominantly sexual mode of transmission: Secondary | ICD-10-CM

## 2014-05-30 DIAGNOSIS — E781 Pure hyperglyceridemia: Secondary | ICD-10-CM

## 2014-05-30 LAB — CBC
HEMATOCRIT: 36.7 % — AB (ref 39.0–52.0)
Hemoglobin: 12.9 g/dL — ABNORMAL LOW (ref 13.0–17.0)
MCH: 43.4 pg — AB (ref 26.0–34.0)
MCHC: 35.1 g/dL (ref 30.0–36.0)
MCV: 123.6 fL — AB (ref 78.0–100.0)
Platelets: 252 10*3/uL (ref 150–400)
RBC: 2.97 MIL/uL — AB (ref 4.22–5.81)
RDW: 14 % (ref 11.5–15.5)
WBC: 5 10*3/uL (ref 4.0–10.5)

## 2014-05-30 NOTE — Progress Notes (Signed)
   Subjective:    Patient ID: Brendan Price, male    DOB: 14-Apr-1971, 43 y.o.   MRN: [id number]  HPI 43 yo M with hx of HIV+ since 2002, lipodystrohy, anal condyloma. Has been maintained on CBV/EFV. March 2015 he underwent laparoscopic bilateral inguinal hernia repair with mesh and open umbilical hernia repair with mesh. Has had no pain, has mild protuberance at umbilical site.  Had prevnar on 05-26-14.  Has new HIV+ partner.   HIV 1 RNA Quant (copies/mL)  Date Value  09/15/2013 <20   03/29/2013 <20   09/23/2012 <20      CD4 T Cell Abs (/uL)  Date Value  09/15/2013 830   03/29/2013 730   09/23/2012 880       Review of Systems     Objective:   Physical Exam  Constitutional: He appears well-developed and well-nourished.  HENT:  Mouth/Throat: No oropharyngeal exudate.  Eyes: EOM are normal. Pupils are equal, round, and reactive to light.  Neck: Neck supple.  Cardiovascular: Normal rate, regular rhythm and normal heart sounds.   Pulmonary/Chest: Effort normal and breath sounds normal.  Abdominal: Soft. Bowel sounds are normal. He exhibits no distension. There is no tenderness.  Genitourinary:     Lymphadenopathy:    He has no cervical adenopathy.          Assessment & Plan:

## 2014-05-30 NOTE — Assessment & Plan Note (Signed)
Will recheck his lipids today.  

## 2014-05-30 NOTE — Assessment & Plan Note (Addendum)
He is doing very well. We did not talk about changing his ART to single tab regimen today. He is content to contniue on his current meds. Will see him back in 6 months.  He is Hep B immune. Will get flu shot at work He needs PCV 23 within in 1 year.

## 2014-05-31 LAB — LIPID PANEL
Cholesterol: 168 mg/dL (ref 0–200)
HDL: 29 mg/dL — AB (ref >39)
TRIGLYCERIDES: 608 mg/dL — AB (ref <150)
Total CHOL/HDL Ratio: 5.8 Ratio

## 2014-05-31 LAB — URINE CYTOLOGY ANCILLARY ONLY
CHLAMYDIA, DNA PROBE: NEGATIVE
NEISSERIA GONORRHEA: NEGATIVE

## 2014-05-31 LAB — COMPREHENSIVE METABOLIC PANEL
ALT: 74 U/L — ABNORMAL HIGH (ref 0–53)
AST: 49 U/L — ABNORMAL HIGH (ref 0–37)
Albumin: 4.3 g/dL (ref 3.5–5.2)
Alkaline Phosphatase: 83 U/L (ref 39–117)
BILIRUBIN TOTAL: 0.5 mg/dL (ref 0.2–1.2)
BUN: 12 mg/dL (ref 6–23)
CALCIUM: 9.2 mg/dL (ref 8.4–10.5)
CHLORIDE: 107 meq/L (ref 96–112)
CO2: 24 meq/L (ref 19–32)
CREATININE: 0.62 mg/dL (ref 0.50–1.35)
GLUCOSE: 86 mg/dL (ref 70–99)
Potassium: 3.9 mEq/L (ref 3.5–5.3)
Sodium: 140 mEq/L (ref 135–145)
Total Protein: 6.9 g/dL (ref 6.0–8.3)

## 2014-05-31 LAB — T-HELPER CELL (CD4) - (RCID CLINIC ONLY)
CD4 % Helper T Cell: 44 % (ref 33–55)
CD4 T CELL ABS: 810 /uL (ref 400–2700)

## 2014-05-31 LAB — HIV-1 RNA QUANT-NO REFLEX-BLD
HIV 1 RNA Quant: <20 copies/mL (ref <20)
HIV-1 RNA Quant, Log: <1.3 {Log} (ref <1.30)

## 2014-06-23 ENCOUNTER — Telehealth: Payer: Self-pay | Admitting: *Deleted

## 2014-06-23 ENCOUNTER — Other Ambulatory Visit: Payer: Self-pay | Admitting: Infectious Diseases

## 2014-06-23 NOTE — Telephone Encounter (Signed)
Requesting refill for Mobic rx.  MD please associate rx with a clinical problem and advise about refills.

## 2014-07-02 ENCOUNTER — Other Ambulatory Visit: Payer: Self-pay | Admitting: Infectious Diseases

## 2014-07-02 DIAGNOSIS — M1990 Unspecified osteoarthritis, unspecified site: Secondary | ICD-10-CM | POA: Insufficient documentation

## 2014-07-02 MED ORDER — MELOXICAM 15 MG PO TABS: 15.0000 mg | ORAL_TABLET | Freq: Every day | ORAL | Status: DC

## 2014-07-02 NOTE — Telephone Encounter (Signed)
rx corrected 

## 2014-07-04 NOTE — Telephone Encounter (Signed)
Phoned in the prescription to patient's pharmacy.

## 2014-10-27 ENCOUNTER — Other Ambulatory Visit: Payer: Self-pay | Admitting: *Deleted

## 2014-10-27 DIAGNOSIS — Z113 Encounter for screening for infections with a predominantly sexual mode of transmission: Secondary | ICD-10-CM

## 2014-11-17 ENCOUNTER — Other Ambulatory Visit: Payer: Self-pay | Admitting: *Deleted

## 2014-11-17 DIAGNOSIS — B20 Human immunodeficiency virus [HIV] disease: Secondary | ICD-10-CM

## 2014-11-17 MED ORDER — EFAVIRENZ 600 MG PO TABS: 600.0000 mg | ORAL_TABLET | Freq: Every day | ORAL | Status: DC

## 2014-11-17 MED ORDER — LAMIVUDINE-ZIDOVUDINE 150-300 MG PO TABS: 1.0000 | ORAL_TABLET | Freq: Two times a day (BID) | ORAL | Status: DC

## 2015-03-15 ENCOUNTER — Encounter: Payer: Self-pay | Admitting: Infectious Diseases

## 2015-03-15 ENCOUNTER — Ambulatory Visit (INDEPENDENT_AMBULATORY_CARE_PROVIDER_SITE_OTHER): Payer: PRIVATE HEALTH INSURANCE | Admitting: Infectious Diseases

## 2015-03-15 VITALS — BP 134/89 | HR 80 | Temp 98.1°F | Wt 173.0 lb

## 2015-03-15 DIAGNOSIS — A630 Anogenital (venereal) warts: Secondary | ICD-10-CM | POA: Diagnosis not present

## 2015-03-15 DIAGNOSIS — Z21 Asymptomatic human immunodeficiency virus [HIV] infection status: Secondary | ICD-10-CM

## 2015-03-15 DIAGNOSIS — E781 Pure hyperglyceridemia: Secondary | ICD-10-CM

## 2015-03-15 DIAGNOSIS — Z113 Encounter for screening for infections with a predominantly sexual mode of transmission: Secondary | ICD-10-CM

## 2015-03-15 DIAGNOSIS — B20 Human immunodeficiency virus [HIV] disease: Secondary | ICD-10-CM

## 2015-03-15 DIAGNOSIS — E881 Lipodystrophy, not elsewhere classified: Secondary | ICD-10-CM

## 2015-03-15 LAB — COMPREHENSIVE METABOLIC PANEL
ALT: 69 U/L — AB (ref 9–46)
AST: 39 U/L (ref 10–40)
Albumin: 4.3 g/dL (ref 3.6–5.1)
Alkaline Phosphatase: 73 U/L (ref 40–115)
BUN: 16 mg/dL (ref 7–25)
CHLORIDE: 106 meq/L (ref 98–110)
CO2: 24 meq/L (ref 20–31)
CREATININE: 0.67 mg/dL (ref 0.60–1.35)
Calcium: 9 mg/dL (ref 8.6–10.3)
GLUCOSE: 97 mg/dL (ref 65–99)
Potassium: 4 mEq/L (ref 3.5–5.3)
SODIUM: 138 meq/L (ref 135–146)
TOTAL PROTEIN: 6.7 g/dL (ref 6.1–8.1)
Total Bilirubin: 0.6 mg/dL (ref 0.2–1.2)

## 2015-03-15 LAB — LIPID PANEL
Cholesterol: 162 mg/dL (ref 125–200)
HDL: 28 mg/dL — ABNORMAL LOW (ref >=40)
LDL Cholesterol: 55 mg/dL (ref <130)
TRIGLYCERIDES: 396 mg/dL — AB (ref <150)
Total CHOL/HDL Ratio: 5.8 Ratio — ABNORMAL HIGH (ref <=5.0)
VLDL: 79 mg/dL — ABNORMAL HIGH (ref <30)

## 2015-03-15 LAB — CBC
HCT: 35.6 % — ABNORMAL LOW (ref 39.0–52.0)
HEMOGLOBIN: 12.8 g/dL — AB (ref 13.0–17.0)
MCH: 44.1 pg — AB (ref 26.0–34.0)
MCHC: 36 g/dL (ref 30.0–36.0)
MCV: 122.8 fL — ABNORMAL HIGH (ref 78.0–100.0)
MPV: 9.9 fL (ref 8.6–12.4)
Platelets: 230 10*3/uL (ref 150–400)
RBC: 2.9 MIL/uL — ABNORMAL LOW (ref 4.22–5.81)
RDW: 13.9 % (ref 11.5–15.5)
WBC: 4.4 10*3/uL (ref 4.0–10.5)

## 2015-03-15 NOTE — Progress Notes (Signed)
   Subjective:    Patient ID: Brendan Price, male    DOB: 1971/07/26, 44 y.o.   MRN: [id number]  HPI 44 yo M with hx of HIV+ since 2002, lipodystrohy, anal condyloma. Has been maintained on CBV/EFV. March 2015 he underwent laparoscopic bilateral inguinal hernia repair with mesh and open umbilical hernia repair with mesh.  HIV 1 RNA QUANT (copies/mL)  Date Value  05/30/2014 <20  09/15/2013 <20  03/29/2013 <20   CD4 T CELL ABS  Date Value  05/30/2014 810 /uL  09/15/2013 830 /uL  03/29/2013 730 cmm    Has been doing well. Been traveling some.  Has had surgical f/u for his hernia repairs (Dr Zella Richer 11-02-14).  Has gained some wt.   No problems with EFV/CBV.  Still with HIV+ partner.   Review of Systems  Constitutional: Negative for appetite change and unexpected weight change.  Gastrointestinal: Negative for diarrhea and constipation.  Genitourinary: Negative for difficulty urinating.       Objective:   Physical Exam  Constitutional: He appears well-developed and well-nourished.  HENT:  Mouth/Throat: No oropharyngeal exudate.  Eyes: EOM are normal. Pupils are equal, round, and reactive to light.  Neck: Neck supple.  Cardiovascular: Normal rate, regular rhythm and normal heart sounds.   Pulmonary/Chest: Effort normal and breath sounds normal.  Abdominal: Soft. Bowel sounds are normal. There is no tenderness.  Lymphadenopathy:    He has no cervical adenopathy.      Assessment & Plan:

## 2015-03-15 NOTE — Assessment & Plan Note (Signed)
Will recheck today.  Changing his ART may help this.

## 2015-03-15 NOTE — Assessment & Plan Note (Signed)
Is doing well on his original art.  Offered to change him to genvoya, he wishes to defer.  Will send him to lab today Offered/refused condoms.  See him back in 6 months.

## 2015-03-15 NOTE — Assessment & Plan Note (Signed)
Will continue to monitor, states he is currently wart free

## 2015-03-15 NOTE — Assessment & Plan Note (Signed)
Changing his ART could help this.  He defers.

## 2015-03-16 LAB — T-HELPER CELL (CD4) - (RCID CLINIC ONLY)
CD4 % Helper T Cell: 38 % (ref 33–55)
CD4 T Cell Abs: 740 /uL (ref 400–2700)

## 2015-03-16 LAB — RPR

## 2015-03-17 LAB — HIV-1 RNA QUANT-NO REFLEX-BLD: HIV-1 RNA Quant, Log: <1.3 {Log} (ref <1.30)

## 2015-03-22 ENCOUNTER — Encounter: Payer: Self-pay | Admitting: Infectious Diseases

## 2015-04-19 ENCOUNTER — Other Ambulatory Visit: Payer: Self-pay | Admitting: Infectious Diseases

## 2015-04-19 MED ORDER — MELOXICAM 15 MG PO TABS: 15.0000 mg | ORAL_TABLET | Freq: Every day | ORAL | Status: DC

## 2015-04-19 NOTE — Addendum Note (Signed)
Addended by: Lorne Skeens D on: 04/19/2015 03:05 PM   Modules accepted: Orders

## 2015-04-19 NOTE — Telephone Encounter (Signed)
Dr. Johnnye Sima, please verify if the pt should continue receiving refills for Mobic.

## 2015-04-19 NOTE — Telephone Encounter (Signed)
Ok to refill 

## 2015-05-14 ENCOUNTER — Other Ambulatory Visit: Payer: Self-pay | Admitting: Infectious Diseases

## 2015-08-17 ENCOUNTER — Other Ambulatory Visit: Payer: Self-pay | Admitting: *Deleted

## 2015-08-17 ENCOUNTER — Encounter: Payer: Self-pay | Admitting: Infectious Diseases

## 2015-08-17 MED ORDER — MELOXICAM 15 MG PO TABS: 15.0000 mg | ORAL_TABLET | Freq: Every day | ORAL | Status: DC

## 2015-12-15 ENCOUNTER — Other Ambulatory Visit: Payer: Self-pay | Admitting: Infectious Diseases

## 2015-12-15 DIAGNOSIS — B20 Human immunodeficiency virus [HIV] disease: Secondary | ICD-10-CM

## 2016-01-14 ENCOUNTER — Other Ambulatory Visit: Payer: Self-pay | Admitting: Infectious Diseases

## 2016-01-22 ENCOUNTER — Other Ambulatory Visit: Payer: Self-pay | Admitting: Infectious Diseases

## 2016-01-22 DIAGNOSIS — B20 Human immunodeficiency virus [HIV] disease: Secondary | ICD-10-CM

## 2016-01-22 MED ORDER — EFAVIRENZ 600 MG PO TABS: 600.0000 mg | ORAL_TABLET | Freq: Every day | ORAL | Status: DC

## 2016-01-22 MED ORDER — MELOXICAM 15 MG PO TABS: 15.0000 mg | ORAL_TABLET | Freq: Every day | ORAL | Status: DC

## 2016-01-22 NOTE — Telephone Encounter (Signed)
Message sent to the pt  

## 2016-02-13 ENCOUNTER — Other Ambulatory Visit: Payer: Self-pay | Admitting: Nurse Practitioner

## 2016-02-13 ENCOUNTER — Ambulatory Visit
Admission: RE | Admit: 2016-02-13 | Discharge: 2016-02-13 | Disposition: A | Discharge status: Home / Self Care | Payer: PRIVATE HEALTH INSURANCE | Source: Ambulatory Visit | Attending: Nurse Practitioner | Admitting: Nurse Practitioner

## 2016-02-13 DIAGNOSIS — S93401A Sprain of unspecified ligament of right ankle, initial encounter: Secondary | ICD-10-CM

## 2016-03-06 ENCOUNTER — Encounter: Payer: Self-pay | Admitting: Infectious Diseases

## 2016-03-06 ENCOUNTER — Ambulatory Visit (INDEPENDENT_AMBULATORY_CARE_PROVIDER_SITE_OTHER): Payer: PRIVATE HEALTH INSURANCE | Admitting: Infectious Diseases

## 2016-03-06 VITALS — BP 143/95 | HR 78 | Temp 97.7°F | Wt 173.0 lb

## 2016-03-06 DIAGNOSIS — Z113 Encounter for screening for infections with a predominantly sexual mode of transmission: Secondary | ICD-10-CM | POA: Diagnosis not present

## 2016-03-06 DIAGNOSIS — B070 Plantar wart: Secondary | ICD-10-CM

## 2016-03-06 DIAGNOSIS — R21 Rash and other nonspecific skin eruption: Secondary | ICD-10-CM

## 2016-03-06 DIAGNOSIS — B20 Human immunodeficiency virus [HIV] disease: Secondary | ICD-10-CM

## 2016-03-06 DIAGNOSIS — Z79899 Other long term (current) drug therapy: Secondary | ICD-10-CM

## 2016-03-06 LAB — LIPID PANEL
Cholesterol: 180 mg/dL (ref 125–200)
HDL: 28 mg/dL — ABNORMAL LOW (ref >=40)
Total CHOL/HDL Ratio: 6.4 Ratio — ABNORMAL HIGH (ref <=5.0)
Triglycerides: 610 mg/dL — ABNORMAL HIGH (ref <150)

## 2016-03-06 LAB — COMPLETE METABOLIC PANEL WITH GFR
ALT: 120 U/L — AB (ref 9–46)
AST: 83 U/L — AB (ref 10–40)
Albumin: 4.5 g/dL (ref 3.6–5.1)
Alkaline Phosphatase: 72 U/L (ref 40–115)
BUN: 15 mg/dL (ref 7–25)
CALCIUM: 9.6 mg/dL (ref 8.6–10.3)
CHLORIDE: 106 mmol/L (ref 98–110)
CO2: 25 mmol/L (ref 20–31)
CREATININE: 0.85 mg/dL (ref 0.60–1.35)
GFR, Est African American: >89 mL/min (ref >=60)
GFR, Est Non African American: >89 mL/min (ref >=60)
Glucose, Bld: 103 mg/dL — ABNORMAL HIGH (ref 65–99)
Potassium: 4.4 mmol/L (ref 3.5–5.3)
Sodium: 141 mmol/L (ref 135–146)
TOTAL PROTEIN: 7.6 g/dL (ref 6.1–8.1)
Total Bilirubin: 0.9 mg/dL (ref 0.2–1.2)

## 2016-03-06 LAB — CBC
HCT: 36.2 % — ABNORMAL LOW (ref 38.5–50.0)
Hemoglobin: 12.9 g/dL — ABNORMAL LOW (ref 13.2–17.1)
MCH: 46.1 pg — ABNORMAL HIGH (ref 27.0–33.0)
MCHC: 35.6 g/dL (ref 32.0–36.0)
MCV: 129.3 fL — ABNORMAL HIGH (ref 80.0–100.0)
MPV: 9.9 fL (ref 7.5–12.5)
Platelets: 246 10*3/uL (ref 140–400)
RBC: 2.8 MIL/uL — AB (ref 4.20–5.80)
RDW: 13.9 % (ref 11.0–15.0)
WBC: 4.6 10*3/uL (ref 3.8–10.8)

## 2016-03-06 LAB — VITAMIN B12: Vitamin B-12: 657 pg/mL (ref 200–1100)

## 2016-03-06 LAB — MAGNESIUM: Magnesium: 2.1 mg/dL (ref 1.5–2.5)

## 2016-03-06 NOTE — Progress Notes (Signed)
   Subjective:    Patient ID: Brendan Price, male    DOB: April 23, 1971, 45 y.o.   MRN: [id number]  HPI 45 yo M diagnosed with HIV in 2002.  He remains on his initial ART of CBV/EFV, which he reports tolerating well without side effects.  Additionally, he reports no missed doses in the past 4 weeks.  However, about three months ago he missed 4 doses of EFV because he forgot to include them in his pill box.  Patient presents to clinic for routine follow-up with complaints of "skin blotches" and warts to left foot.  Lab Results  Component Value Date   CD4TABS 740 03/15/2015   CD4TABS 810 05/30/2014   CD4TABS 830 09/15/2013   Lab Results  Component Value Date   HIV1RNAQUANT <20 03/15/2015   Review of Systems  Constitutional: Negative for fever and chills.  HENT: Negative for mouth sores and trouble swallowing.   Respiratory: Negative for cough and shortness of breath.   Cardiovascular: Negative for chest pain and leg swelling.  Gastrointestinal: Negative for nausea, abdominal pain, diarrhea and constipation.  Genitourinary: Negative for dysuria and discharge.  Skin: Positive for color change (left midaxillary region, left clavicle, and mid-back).  Neurological: Negative for light-headedness and headaches.      Objective:   Physical Exam  Constitutional: He is oriented to person, place, and time. He appears well-developed and well-nourished.  HENT:  Mouth/Throat: No oropharyngeal exudate.  Eyes: Conjunctivae are normal. Pupils are equal, round, and reactive to light.  Cardiovascular: Normal rate, regular rhythm and normal heart sounds.   No murmur heard. Pulmonary/Chest: Effort normal and breath sounds normal. No respiratory distress. He has no wheezes.  Abdominal: Soft. Bowel sounds are normal. There is no tenderness.  Musculoskeletal: He exhibits no edema.  Lymphadenopathy:    He has no cervical adenopathy.  Neurological: He is alert and oriented to person, place, and time.  Skin:          Assessment & Plan:

## 2016-03-06 NOTE — Assessment & Plan Note (Signed)
Plantar wart noted to L foot--about the size of a quarter. L lateral wart at arch--about the size of a pencil eraser. Since patient seeing Derm for keratosis, will allow derm to determine course of treatment.

## 2016-03-06 NOTE — Assessment & Plan Note (Addendum)
Likely keratosis to L midaxillary region, L clavicle, and mid-back. Patient previously seen by Derm; therefore, will referr to [facility].

## 2016-03-06 NOTE — Assessment & Plan Note (Signed)
Patient doing well today. Offered ART modernization; however, deferred Genvoya. Continue CBV/EFV. Repeat labs today. Condoms offered/refused. RTC in 6 months with labs prior.

## 2016-03-07 LAB — URINE CYTOLOGY ANCILLARY ONLY
Chlamydia: NEGATIVE
NEISSERIA GONORRHEA: NEGATIVE

## 2016-03-07 LAB — RPR

## 2016-03-07 LAB — T-HELPER CELLS (CD4) COUNT (NOT AT ARMC)
CD4 T CELL HELPER: 42 % (ref 33–55)
CD4 T Cell Abs: 840 /uL (ref 400–2700)

## 2016-03-08 LAB — HIV-1 RNA QUANT-NO REFLEX-BLD
HIV 1 RNA Quant: 144 copies/mL — ABNORMAL HIGH (ref <20)
HIV-1 RNA Quant, Log: 2.16 Log copies/mL — ABNORMAL HIGH (ref <1.30)

## 2016-03-10 ENCOUNTER — Other Ambulatory Visit: Payer: Self-pay | Admitting: Infectious Diseases

## 2016-03-10 DIAGNOSIS — B20 Human immunodeficiency virus [HIV] disease: Secondary | ICD-10-CM

## 2016-03-12 ENCOUNTER — Encounter: Payer: Self-pay | Admitting: Infectious Diseases

## 2016-03-12 ENCOUNTER — Telehealth: Payer: Self-pay | Admitting: *Deleted

## 2016-03-12 NOTE — Telephone Encounter (Signed)
Patient sent an email request for Mobic. This was not addressed at last office visit on 03/06/16, please advise. Myrtis Hopping

## 2016-03-13 ENCOUNTER — Other Ambulatory Visit: Payer: Self-pay | Admitting: Infectious Diseases

## 2016-03-13 DIAGNOSIS — B20 Human immunodeficiency virus [HIV] disease: Secondary | ICD-10-CM

## 2016-03-13 MED ORDER — MELOXICAM 15 MG PO TABS: 15.0000 mg | ORAL_TABLET | Freq: Every day | ORAL | 1 refills | Status: DC

## 2016-03-14 ENCOUNTER — Encounter: Payer: Self-pay | Admitting: Infectious Diseases

## 2016-04-09 ENCOUNTER — Other Ambulatory Visit: Payer: Self-pay | Admitting: Infectious Diseases

## 2016-04-09 DIAGNOSIS — B20 Human immunodeficiency virus [HIV] disease: Secondary | ICD-10-CM

## 2016-04-11 ENCOUNTER — Other Ambulatory Visit: Payer: Self-pay | Admitting: Urology

## 2016-04-16 ENCOUNTER — Encounter (HOSPITAL_COMMUNITY): Payer: Self-pay

## 2016-04-18 ENCOUNTER — Encounter (HOSPITAL_COMMUNITY): Payer: Self-pay | Admitting: *Deleted

## 2016-04-24 NOTE — H&P (Signed)
Office Visit Report     04/11/2016   --------------------------------------------------------------------------------   Brendan Price  MRN: [id number]  PRIMARY CARE:  Delanna Ahmadi, MD  DOB: [birth year], 45 year old Male  REFERRING:    SSN: -[id number]  PROVIDER:  Kathie Rhodes, M.D.    LOCATION:  [facility] [phone]   --------------------------------------------------------------------------------   CC: I have a ureteral stone.  HPI: Brendan Price is a 45 year-old male patient who is here for a ureteral calculus.  The kidney stone was on the right side. He first noticed the symptoms approximately 03/28/2016. This is not his first kidney stone. He is currently having flank pain. He denies having back pain, groin pain, nausea, vomiting, fever, and chills. He does have a burning sensation when he urinates. He has caught a stone in his urine strainer since his symptoms began.   He has had eswl and ureteroscopy for treatment of his stones in the past.   He reports that approximately 2 weeks ago he was experiencing some intermittent cramping type pain in the right flank and also experienced some burning at the tip of the penis. This was followed by the passage of a small stone and some small clots. He continues to have intermittent right flank discomfort that is not severe and is not relieved by positional change. He is not having any hematuria or fever.     ALLERGIES: Ampicillin CAPS    MEDICATIONS: ASPIRIN EC PO Daily  Combivir TABS Oral  Famotidine TABS Oral  LORAZEPAM PO Daily  Meloxicam TABS Oral  Omeprazole 20 MG Oral Capsule Delayed Release Oral  PROCHLORPERAZINE RC PRN  Sustiva TABS Oral  ValACYclovir HCl - 1 GM Oral Tablet Oral  Zolpidem Tartrate 10 MG Oral Tablet Oral  Zyrtec TABS Oral     GU PSH: Cystoscopy Ureteroscopy - about 1998 Renal ESWL - 2008      PSH Notes: Cystoscopy With Ureteroscopy, Lithotripsy   NON-GU PSH:  Colonoscopy Tonsillectomy    GU PMH: Kidney Stone, Left, Kidney stone on left side - 2014      PMH Notes: History of nephrolithiasis: He had ureteroscopy on the left in 1998 and right lithotripsy for a 5 x 5 UPJ stone in 2002.  24-hour urine: Llow citrate and normal calcium with a good urine volume of 2.8 L.  KUB in 2011 and 2012. This showed stable stones over that period of time.   NON-GU PMH: Human immunodeficiency virus [HIV] disease, HIV Infection - 2014 Personal history of other diseases of the respiratory system, History of allergic rhinitis - 2014 Personal history of other specified conditions, History of heartburn - 2014 Unspecified disorder of calcium metabolism, Hypercalciuria - 2014    FAMILY HISTORY: Diabetes - Mother, Father Family Health Status - Father's Age - Mother Family Health Status - Mother's Age - Mother Hypertension - Mother, Father Hypothyroidism - Mother   SOCIAL HISTORY: Marital Status: Single Social Drinker.  Drinks 2 caffeinated drinks per day.     Notes: Tobacco Use, Caffeine Use, Alcohol Use, Occupation:, Marital History - Single   REVIEW OF SYSTEMS:    GU Review Male:   Patient reports frequent urination, get up at night to urinate, leakage of urine, and stream starts and stops. Patient denies hard to postpone urination, burning/ pain with urination, trouble starting your stream, have to strain to urinate , erection problems, and penile pain.  Gastrointestinal (Upper):   Patient denies nausea, vomiting, and indigestion/ heartburn.  Gastrointestinal (Lower):  Patient denies diarrhea and constipation.  Constitutional:   Patient denies fever, night sweats, weight loss, and fatigue.  Skin:   Patient denies skin rash/ lesion and itching.  Eyes:   Patient denies blurred vision and double vision.  Ears/ Nose/ Throat:   Patient denies sore throat and sinus problems.  Hematologic/Lymphatic:   Patient denies easy bruising and swollen glands.   Cardiovascular:   Patient denies leg swelling and chest pains.  Respiratory:   Patient denies cough and shortness of breath.  Endocrine:   Patient denies excessive thirst.  Musculoskeletal:   Patient denies back pain and joint pain.  Neurological:   Patient denies headaches and dizziness.  Psychologic:   Patient denies depression and anxiety.   VITAL SIGNS:      04/11/2016 08:20 AM  Height 71 in / 180.34 cm  BP 149/93 mmHg  Pulse 75 /min  Heart Rate 75 /min   MULTI-SYSTEM PHYSICAL EXAMINATION:    Constitutional: Well-nourished. No physical deformities. Normally developed. Good grooming.  Neck: Neck symmetrical, not swollen. Normal tracheal position.  Respiratory: No labored breathing, no use of accessory muscles.   Cardiovascular: Normal temperature, normal extremity pulses, no swelling, no varicosities.  Lymphatic: No enlargement of neck, axillae, groin.  Skin: No paleness, no jaundice, no cyanosis. No lesion, no ulcer, no rash.  Neurologic / Psychiatric: Oriented to time, oriented to place, oriented to person. No depression, no anxiety, no agitation.  Gastrointestinal: No mass, no tenderness, no rigidity, non obese abdomen.  Eyes: Normal conjunctivae. Normal eyelids.  Ears, Nose, Mouth, and Throat: Left ear no scars, no lesions, no masses. Right ear no scars, no lesions, no masses. Nose no scars, no lesions, no masses. Normal hearing. Normal lips.  Musculoskeletal: Right CVA tenderness spine, ribs, pelvis. Normal gait and station of head and neck.     PAST DATA REVIEWED:  Source Of History:  Patient, Outside Source  Records Review:   Previous Patient Records, POC Tool  X-Ray Review: KUB: Reviewed Films. A KUB in 2012 revealed a single 2-3 mm stone in the lower pole of the left kidney.    PROCEDURES:         C.T. Urogram - [phone]      Is again today revealed bilateral punctate renal stones in the upper and middle pole of both kidneys with stones in the lower poles that are a  little bit larger as well as a 7 x 5 mm stone located at the UPJ on the right hand side. The right UPJ stone had Hounsfield units of 700.         Urinalysis w/Scope - 81001 Dipstick Dipstick Cont'd Micro  Specimen: Voided Bilirubin: Neg WBC/hpf: 0-5/hpf  Color: Yellow Ketones: Neg RBC/hpf: 3-10/hpf  Appearance: Clear Blood: 2+ Bacteria: NS (Not Seen)  Specific Gravity: 1.010 Protein: Neg Cystals: NS (Not Seen)  pH: 6.0 Urobilinogen: 0.2 Casts: NS (Not Seen)  Glucose: Neg Nitrites: Neg Trichomonas: Not Present    Leukocyte Esterase: Neg Mucous: Not Present      Epithelial Cells: 0-5/hpf      Yeast: NS (Not Seen)      Sperm: Not Present    ASSESSMENT:      ICD-10 Details  1 GU:   Calculus Ureter - N20.1 Right, We discussed the management of urinary stones. These options include observation, ureteroscopy, shockwave lithotripsy, and PCNL. We discussed which options are relevant to these particular stones. We discussed the natural history of stones as well as the complications of untreated stones  and the impact on quality of life without treatment as well as with each of the above listed treatments. We also discussed the efficacy of each treatment in its ability to clear the stone burden. With any of these management options I discussed the signs and symptoms of infection and the need for emergent treatment should these be experienced. For each option we discussed the ability of each procedure to clear the patient of their stone burden. For observation I described the risks which include but are not limited to silent renal damage, life-threatening infection, need for emergent surgery, failure to pass stone, and pain. For ureteroscopy I described the risks which include heart attack, stroke, pulmonary embolus, death, bleeding, infection, damage to contiguous structures, positioning injury, ureteral stricture, ureteral avulsion, ureteral injury, need for ureteral stent, inability to perform ureteroscopy,  need for an interval procedure, inability to clear stone burden, stent discomfort and pain. For shockwave lithotripsy I described the risks which include arrhythmia, kidney contusion, kidney hemorrhage, need for transfusion, long-term risk of diabetes or hypertension, back discomfort, flank ecchymosis, flank abrasion, inability to break up stone, inability to pass stone fragments, Steinstrasse, infection associated with obstructing stones, need for different surgical procedure and possible need for repeat shockwave lithotripsy.   2   Kidney Stone - N20.0 Bilateral, He has bilateral nonobstructing renal calculi.          Notes:   His stone will be sent for composition analysis today and I will contact him with the results.   His stone is easily visible on the KUB reproduction/reformatting of his CT scan located adjacent to the transverse process on the right side at the level of L2.   He has elected to proceed with lithotripsy.    PLAN:           Orders Labs Stone Analysis  X-Rays: C.T. Stone Protocol Without Contrast  X-Ray Notes: History:  Hematuria: Yes/No  Patient to see MD after exam: Yes/No  Previous exam: CT / IVP/ US/ KUB/ None  When:  Where:  Diabetic: Yes/ No  BUN/ Creatinine:  Date of last BUN Creatinine:  Weight in pounds:  Allergy- IV Contrast: Yes/ No  Conflicting diabetic meds: Yes/ No  Diabetic Meds:  Prior Authorization #: [id number]           Schedule Return Visit: 1-2 Weeks - Schedule Surgery          Document Letter(s):  Created for Patient: Clinical Summary    * Signed by Kathie Rhodes, M.D. on 04/11/16 at 9:31 AM (EDT)*

## 2016-04-25 ENCOUNTER — Ambulatory Visit (HOSPITAL_COMMUNITY): Payer: PRIVATE HEALTH INSURANCE

## 2016-04-25 ENCOUNTER — Encounter (HOSPITAL_COMMUNITY): Payer: Self-pay | Admitting: *Deleted

## 2016-04-25 ENCOUNTER — Encounter (HOSPITAL_COMMUNITY)
Admission: RE | Disposition: A | Discharge status: Home / Self Care | Payer: Self-pay | Source: Ambulatory Visit | Attending: Urology

## 2016-04-25 ENCOUNTER — Ambulatory Visit (HOSPITAL_COMMUNITY)
Admission: RE | Admit: 2016-04-25 | Discharge: 2016-04-25 | Disposition: A | Discharge status: Home / Self Care | Payer: PRIVATE HEALTH INSURANCE | Source: Ambulatory Visit | Attending: Urology | Admitting: Urology

## 2016-04-25 DIAGNOSIS — B20 Human immunodeficiency virus [HIV] disease: Secondary | ICD-10-CM | POA: Insufficient documentation

## 2016-04-25 DIAGNOSIS — Z7982 Long term (current) use of aspirin: Secondary | ICD-10-CM | POA: Diagnosis not present

## 2016-04-25 DIAGNOSIS — N201 Calculus of ureter: Secondary | ICD-10-CM | POA: Diagnosis present

## 2016-04-25 DIAGNOSIS — Z791 Long term (current) use of non-steroidal anti-inflammatories (NSAID): Secondary | ICD-10-CM | POA: Insufficient documentation

## 2016-04-25 DIAGNOSIS — Z79899 Other long term (current) drug therapy: Secondary | ICD-10-CM | POA: Diagnosis not present

## 2016-04-25 DIAGNOSIS — N200 Calculus of kidney: Secondary | ICD-10-CM

## 2016-04-25 SURGERY — LITHOTRIPSY, ESWL
Anesthesia: LOCAL | Laterality: Right

## 2016-04-25 MED ORDER — HYDROMORPHONE HCL 1 MG/ML IJ SOLN
1.0000 mg | INTRAMUSCULAR | Status: AC
  Administered 2016-04-25: 1 mg via INTRAVENOUS
  Filled 2016-04-25: qty 1

## 2016-04-25 MED ORDER — CIPROFLOXACIN HCL 500 MG PO TABS
500.0000 mg | ORAL_TABLET | ORAL | Status: AC
  Administered 2016-04-25: 500 mg via ORAL
  Filled 2016-04-25: qty 1

## 2016-04-25 MED ORDER — DIPHENHYDRAMINE HCL 25 MG PO CAPS
25.0000 mg | ORAL_CAPSULE | ORAL | Status: AC
  Administered 2016-04-25: 25 mg via ORAL
  Filled 2016-04-25: qty 1

## 2016-04-25 MED ORDER — SODIUM CHLORIDE 0.9 % IV SOLN
INTRAVENOUS | Status: DC
  Administered 2016-04-25: 08:00:00 via INTRAVENOUS

## 2016-04-25 MED ORDER — DIAZEPAM 5 MG PO TABS
10.0000 mg | ORAL_TABLET | ORAL | Status: AC
  Administered 2016-04-25: 10 mg via ORAL
  Filled 2016-04-25: qty 2

## 2016-04-25 NOTE — Op Note (Signed)
Please see scanned chart for ESWL procedure note. 

## 2016-04-25 NOTE — Interval H&P Note (Signed)
History and Physical Interval Note:  04/25/2016 8:57 AM  Brendan Price  has presented today for surgery, with the diagnosis of RIGHT URETEROPELVIC JUNCTION STONE  The various methods of treatment have been discussed with the patient and family. After consideration of risks, benefits and other options for treatment, the patient has consented to  Procedure(s): RIGHT EXTRACORPOREAL SHOCK WAVE LITHOTRIPSY (ESWL) (Right) as a surgical intervention .  The patient's history has been reviewed, patient examined, no change in status, stable for surgery.  I have reviewed the patient's chart and labs.  Questions were answered to the patient's satisfaction.     Breawna Montenegro,LES

## 2016-04-25 NOTE — Discharge Instructions (Signed)
1. You should strain your urine and collect all fragments and bring them to your follow up appointment.  °2. You should take your pain medication as needed.  Please call if your pain is severe to the point that it is not controlled with your pain medication. °3. You should call if you develop fever > 101 or persistent nausea or vomiting. °4. Your doctor may prescribe tamsulosin to take to help facilitate stone passage. °

## 2016-05-03 ENCOUNTER — Other Ambulatory Visit: Payer: Self-pay | Admitting: Infectious Diseases

## 2016-05-03 DIAGNOSIS — B20 Human immunodeficiency virus [HIV] disease: Secondary | ICD-10-CM

## 2016-06-02 ENCOUNTER — Other Ambulatory Visit: Payer: Self-pay | Admitting: Infectious Diseases

## 2016-06-02 DIAGNOSIS — B20 Human immunodeficiency virus [HIV] disease: Secondary | ICD-10-CM

## 2016-06-20 ENCOUNTER — Encounter: Payer: Self-pay | Admitting: Infectious Diseases

## 2016-08-19 DIAGNOSIS — L0390 Cellulitis, unspecified: Secondary | ICD-10-CM

## 2016-08-19 HISTORY — DX: Cellulitis, unspecified: L03.90

## 2016-10-07 ENCOUNTER — Other Ambulatory Visit: Payer: Self-pay | Admitting: Infectious Diseases

## 2016-10-07 DIAGNOSIS — B20 Human immunodeficiency virus [HIV] disease: Secondary | ICD-10-CM

## 2016-10-08 ENCOUNTER — Other Ambulatory Visit: Payer: Self-pay

## 2016-10-08 DIAGNOSIS — B20 Human immunodeficiency virus [HIV] disease: Secondary | ICD-10-CM

## 2016-10-08 MED ORDER — EFAVIRENZ 600 MG PO TABS: 600.0000 mg | ORAL_TABLET | Freq: Every day | ORAL | 2 refills | Status: DC

## 2016-10-08 MED ORDER — LAMIVUDINE-ZIDOVUDINE 150-300 MG PO TABS: 1.0000 | ORAL_TABLET | Freq: Two times a day (BID) | ORAL | 2 refills | Status: DC

## 2016-10-24 ENCOUNTER — Telehealth: Payer: Self-pay | Admitting: *Deleted

## 2016-10-24 NOTE — Telephone Encounter (Signed)
Patient called, asked for an appointment as soon as possible. He had a wart removed from his foot by dermatology, developed an area he is concerned about. He went back to dermatology Tuesday and was given 2 creams.  There was delay in getting the medication (pharmacy had to order them), he now feels he has developed an infection at the site - reports drainage and odor.  RN advised patient to return to dermatology for evaluation and culture/sensitivities.  He will call us to give update. Landis Gandy, RN

## 2016-10-25 ENCOUNTER — Encounter: Payer: Self-pay | Admitting: Infectious Diseases

## 2016-10-25 NOTE — Telephone Encounter (Signed)
i'd be glad to see him as an overbook on Monday or Friday thanks

## 2016-10-26 ENCOUNTER — Encounter: Payer: Self-pay | Admitting: Infectious Diseases

## 2016-10-26 ENCOUNTER — Encounter (HOSPITAL_COMMUNITY): Payer: Self-pay | Admitting: Emergency Medicine

## 2016-10-26 ENCOUNTER — Emergency Department (HOSPITAL_COMMUNITY)
Admission: EM | Admit: 2016-10-26 | Discharge: 2016-10-26 | Disposition: A | Discharge status: Home / Self Care | Payer: PRIVATE HEALTH INSURANCE | Attending: Emergency Medicine | Admitting: Emergency Medicine

## 2016-10-26 DIAGNOSIS — M79672 Pain in left foot: Secondary | ICD-10-CM | POA: Diagnosis present

## 2016-10-26 DIAGNOSIS — Z79899 Other long term (current) drug therapy: Secondary | ICD-10-CM | POA: Insufficient documentation

## 2016-10-26 DIAGNOSIS — L03116 Cellulitis of left lower limb: Secondary | ICD-10-CM | POA: Diagnosis not present

## 2016-10-26 LAB — BASIC METABOLIC PANEL
Anion gap: 5 (ref 5–15)
BUN: 11 mg/dL (ref 6–20)
CALCIUM: 9 mg/dL (ref 8.9–10.3)
CO2: 24 mmol/L (ref 22–32)
Chloride: 109 mmol/L (ref 101–111)
Creatinine, Ser: 0.77 mg/dL (ref 0.61–1.24)
GFR calc Af Amer: >60 mL/min (ref >60)
GLUCOSE: 97 mg/dL (ref 65–99)
POTASSIUM: 3.7 mmol/L (ref 3.5–5.1)
SODIUM: 138 mmol/L (ref 135–145)

## 2016-10-26 LAB — CBC
HCT: 34.7 % — ABNORMAL LOW (ref 39.0–52.0)
Hemoglobin: 12.8 g/dL — ABNORMAL LOW (ref 13.0–17.0)
MCH: 44.8 pg — AB (ref 26.0–34.0)
MCHC: 36.9 g/dL — ABNORMAL HIGH (ref 30.0–36.0)
MCV: 121.3 fL — AB (ref 78.0–100.0)
PLATELETS: 217 10*3/uL (ref 150–400)
RBC: 2.86 MIL/uL — AB (ref 4.22–5.81)
RDW: 13 % (ref 11.5–15.5)
WBC: 8.9 10*3/uL (ref 4.0–10.5)

## 2016-10-26 MED ORDER — VANCOMYCIN HCL IN DEXTROSE 1-5 GM/200ML-% IV SOLN
1000.0000 mg | Freq: Once | INTRAVENOUS | Status: AC
  Administered 2016-10-26: 1000 mg via INTRAVENOUS
  Filled 2016-10-26: qty 200

## 2016-10-26 MED ORDER — DOXYCYCLINE HYCLATE 100 MG PO TABS: 100.0000 mg | ORAL_TABLET | Freq: Two times a day (BID) | ORAL | 0 refills | Status: AC

## 2016-10-26 NOTE — ED Triage Notes (Addendum)
Patient c/o wound to left lateral foot that patient states  began as wart he was treating with OTC medication. Patient noticed blister forming to area and went to dermatologist. Patient from UC today for evaluation of wound for potential infection. Reports pain, drainage, redness, and swelling to area. Denies fevers.

## 2016-10-26 NOTE — ED Provider Notes (Signed)
[place] DEPT Provider Note   CSN: [id number] Arrival date & time: 10/26/16  1422     History   Chief Complaint Chief Complaint  Patient presents with  . Wound Infection    HPI Brendan Price is a 46 y.o. male.  HPI  46 y.o. male, presents to the Emergency Department today complaining of left lateral foot pain. States seen by dermatology on last week and had wart removed. Pt has been treating with OTC medications. Pt went to UC today for evaluation due to swelling and erythema today. Noted yesterday as well. Sent to ED for further eval Reports pain and drainage to area. Redness as well as swelling. No fevers. Has taken Doxy yesterday and today. No numbness/tingling. Had xray done at [facility] that showed no osteomyelitis. No other symptoms noted   Past Medical History:  Diagnosis Date  . Allergy   . GERD (gastroesophageal reflux disease)   . HIV infection ([place])   . Inguinal hernia bilateral, non-recurrent   . Kidney stones    hx of  . Seasonal allergies   . Umbilical hernia     Patient Active Problem List   Diagnosis Date Noted  . Plantar wart of left foot 03/06/2016  . Arthritis 07/02/2014  . 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 Epistaxis 04/06/2012  . Blepharitis of right eye 05/08/2011  . Allergic rhinitis 12/07/2010  . GENITAL HERPES 06/22/2009  . CENTRAL SEROUS RETINOPATHY 11/25/2007  . GENITOURINARY DISORDER, MALE 07/03/2007  . RASH AND OTHER NONSPECIFIC SKIN ERUPTION 07/03/2007  . HIV disease ([place]) 05/29/2006  . HERPES ZOSTER 05/29/2006  . 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 CYTOMEGALOVIRAL DISEASE 05/29/2006  . HYPERTRIGLYCERIDEMIA 05/29/2006    Past Surgical History:  Procedure Laterality Date  . HERNIA REPAIR    . INGUINAL HERNIA REPAIR N/A 10/19/2013   Procedure: LAPAROSCOPIC BILATERAL INGUINAL HERNIA REPAIR WITH UMBILICAL HERNIA REPAIR;  Surgeon: Odis Hollingshead, MD;  Location: [place];  Service:  General;  Laterality: N/A;  . INSERTION OF MESH N/A 10/19/2013   Procedure: INSERTION OF MESH;  Surgeon: Odis Hollingshead, MD;  Location: [place];  Service: General;  Laterality: N/A;  Mesh to bilateral groin and abdomen  . LITHOTRIPSY  2000  . TONSILLECTOMY  1977  . URETEROSCOPY  12/1996     Home Medications    Prior to Admission medications   Medication Sig Start Date End Date Taking? Authorizing Provider  cetirizine (ZYRTEC) 10 MG tablet Take 10 mg by mouth 2 (two) times daily.      Historical Provider, MD  chlorproMAZINE (THORAZINE) 10 MG tablet Take 10 mg by mouth daily as needed for nausea.     Historical Provider, MD  efavirenz (SUSTIVA) 600 MG tablet Take 1 tablet (600 mg total) by mouth at bedtime. 10/08/16   Campbell Riches, MD  famotidine (PEPCID) 20 MG tablet Take 20 mg by mouth 2 (two) times daily.      Historical Provider, MD  fluticasone (FLONASE) 50 MCG/ACT nasal spray INSTILL 1- 2 SPRAYS IN EACH NOSTRIL EVERY DAY AS NEEDED 05/15/15   Campbell Riches, MD  lamiVUDine-zidovudine (COMBIVIR) 150-300 MG tablet Take 1 tablet by mouth 2 (two) times daily. 10/08/16   Campbell Riches, MD  LORazepam (ATIVAN) 1 MG tablet Take 1 mg by mouth every 8 (eight) hours as needed for anxiety.     Historical Provider, MD  omeprazole (PRILOSEC) 20 MG capsule Take 20 mg by mouth daily.  Historical Provider, MD  valACYclovir (VALTREX) 1000 MG tablet Take 1,000 mg by mouth 2 (two) times daily as needed.    Historical Provider, MD  zolpidem (AMBIEN) 10 MG tablet Take 10 mg by mouth at bedtime as needed for sleep.     Historical Provider, MD   Family History Family History  Problem Relation Age of Onset  . Diabetes Mother   . Hypertension Mother   . Hypothyroidism Mother   . Breast cancer Mother   . Diabetes Father   . Arrhythmia Father   . Benign prostatic hyperplasia Father   . Hypertension Sister     Social History Social History  Substance Use Topics  . Smoking status: Never  Smoker  . Smokeless tobacco: Never Used  . Alcohol use 1.0 oz/week    2 Standard drinks or equivalent per week     Comment: occasional   Allergies   Ampicillin   Review of Systems Review of Systems ROS reviewed and all are negative for acute change except as noted in the HPI.  Physical Exam Updated Vital Signs BP 140/70 (BP Location: Right Arm)   Pulse 88   Temp 98.7 F (37.1 C) (Oral)   Resp 18   SpO2 100%   Physical Exam  Constitutional: He is oriented to person, place, and time. Vital signs are normal. He appears well-developed and well-nourished.  HENT:  Head: Normocephalic and atraumatic.  Right Ear: Hearing normal.  Left Ear: Hearing normal.  Eyes: Conjunctivae and EOM are normal. Pupils are equal, round, and reactive to light.  Neck: Normal range of motion. Neck supple.  Cardiovascular: Normal rate, regular rhythm, normal heart sounds and intact distal pulses.   Pulmonary/Chest: Effort normal and breath sounds normal.  Abdominal: Soft.  Musculoskeletal: Normal range of motion.  Neurological: He is alert and oriented to person, place, and time.  Skin: Skin is warm and dry.  See image below. Blistered area with signs of wound dehiscence. Area is not deep to palpation. Bottom of wound visualized. Purulence noted.    Psychiatric: He has a normal mood and affect. His speech is normal and behavior is normal. Thought content normal.  Nursing note and vitals reviewed.      ED Treatments / Results  Labs (all labs ordered are listed, but only abnormal results are displayed) Labs Reviewed  CBC - Abnormal; Notable for the following:       Result Value   RBC 2.86 (*)    Hemoglobin 12.8 (*)    HCT 34.7 (*)    MCV 121.3 (*)    MCH 44.8 (*)    MCHC 36.9 (*)    All other components within normal limits  BASIC METABOLIC PANEL    EKG  EKG Interpretation None      Radiology No results found.  Procedures Procedures (including critical care time)  Medications  Ordered in ED Medications  vancomycin (VANCOCIN) IVPB 1000 mg/200 mL premix (1,000 mg Intravenous New Bag/Given 10/26/16 1538)   Initial Impression / Assessment and Plan / ED Course  I have reviewed the triage vital signs and the nursing notes.  Pertinent labs & imaging results that were available during my care of the patient were reviewed by me and considered in my medical decision making (see chart for details).  Final Clinical Impressions(s) / ED Diagnoses  {I have reviewed and evaluated the relevant laboratory values. {I have reviewed and evaluated the relevant imaging studies.  {I have reviewed the relevant previous healthcare records.  {I  obtained HPI from historian.   ED Course:  Assessment: Pt is a 46 y.o. male with hx HIV who presents with left foot cellulitis. On Doxy that he started yesterday. No fevers. On exam, pt in NAD. Nontoxic/nonseptic appearing. VSS. Afebrile. Lungs CTA. Heart RRR. Abdomen nontender soft.. Labs unremarkable. Imaging from UC with no osteomyelitis shown. Pt is a pharmacist and requested IV antibiotics. Consult with our pharmacy recommended IV Vanc 1 dose due to purulence. Plan is to DC home with continued Doxy and 48 hour wound recheck. At time of discharge, Patient is in no acute distress. Vital Signs are stable. Patient is able to ambulate. Patient able to tolerate PO.   Disposition/Plan:  DC Home Additional Verbal discharge instructions given and discussed with patient.  Pt Instructed to f/u with PCP in the next week for evaluation and treatment of symptoms. Return precautions given Pt acknowledges and agrees with plan  Supervising Physician Merrily Pew, MD  Final diagnoses:  Cellulitis of left foot    New Prescriptions New Prescriptions   No medications on file     Shary Decamp, PA-C 10/26/16 [place], MD 10/27/16 1302

## 2016-10-26 NOTE — Discharge Instructions (Signed)
Please read and follow all provided instructions.  Your diagnoses today include:  1. Cellulitis of left foot    Tests performed today include: Vital signs. See below for your results today.   Medications prescribed:   Take any prescribed medications only as directed.   Home care instructions:  Follow any educational materials contained in this packet. Keep affected area above the level of your heart when possible. Wash area gently twice a day with warm soapy water. Do not apply alcohol or hydrogen peroxide. Cover the area if it draining or weeping.   Follow-up instructions: Return to the Emergency Department in 48 hours for a recheck if your symptoms are not significantly improved.   Please follow-up with your primary care provider in the next 1 week for further evaluation of your symptoms.   Return instructions:  Return to the Emergency Department if you have: Fever Worsening symptoms Worsening pain Worsening swelling Redness of the skin that moves away from the affected area, especially if it streaks away from the affected area  Any other emergent concerns  Your vital signs today were: BP 140/70 (BP Location: Right Arm)    Pulse 88    Temp 98.7 F (37.1 C) (Oral)    Resp 18    SpO2 100%  If your blood pressure (BP) was elevated above 135/85 this visit, please have this repeated by your doctor within one month. --------------

## 2016-10-28 ENCOUNTER — Encounter (HOSPITAL_COMMUNITY): Payer: Self-pay | Admitting: Emergency Medicine

## 2016-10-28 ENCOUNTER — Encounter: Payer: Self-pay | Admitting: Infectious Diseases

## 2016-10-28 ENCOUNTER — Emergency Department (HOSPITAL_COMMUNITY)
Admission: EM | Admit: 2016-10-28 | Discharge: 2016-10-28 | Disposition: A | Discharge status: Home / Self Care | Payer: PRIVATE HEALTH INSURANCE | Attending: Emergency Medicine | Admitting: Emergency Medicine

## 2016-10-28 DIAGNOSIS — Z7982 Long term (current) use of aspirin: Secondary | ICD-10-CM | POA: Diagnosis not present

## 2016-10-28 DIAGNOSIS — Z4801 Encounter for change or removal of surgical wound dressing: Secondary | ICD-10-CM | POA: Insufficient documentation

## 2016-10-28 DIAGNOSIS — Z79899 Other long term (current) drug therapy: Secondary | ICD-10-CM | POA: Diagnosis not present

## 2016-10-28 DIAGNOSIS — Z5189 Encounter for other specified aftercare: Secondary | ICD-10-CM

## 2016-10-28 NOTE — ED Provider Notes (Signed)
[facility] DEPT Provider Note   CSN: [id number] Arrival date & time: 10/28/16  1242   By signing my name below, I, Evelene Croon, attest that this documentation has been prepared under the direction and in the presence of Shary Decamp, PA-C. Electronically Signed: Evelene Croon, Scribe. 10/28/2016. 1:17 PM.  History   Chief Complaint Chief Complaint  Patient presents with  . Wound Check   The history is provided by the patient. No language interpreter was used.   HPI Comments:  Brendan Price is a 46 y.o. male with a history of HIV, who presents to the Emergency Department for a wound check. Pt was seen in the ED on 10/26/2016 for swelling pain and redness to the lateral left foot. He had a wart removed from the site 1 week prior to his visit. He was diagnosed with cellulitis in the ED, instructed to continue doxycycline prescribed to him at Urgent care that day and to return for a wound check. Pt states the swelling to the site has improved. Here for wound check. No fevers. No other symptoms noted.   Past Medical History:  Diagnosis Date  . Allergy   . GERD (gastroesophageal reflux disease)   . HIV infection ([place])   . Inguinal hernia bilateral, non-recurrent   . Kidney stones    hx of  . Seasonal allergies   . Umbilical hernia     Patient Active Problem List   Diagnosis Date Noted  . Plantar wart of left foot 03/06/2016  . Arthritis 07/02/2014  . 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 Epistaxis 04/06/2012  . Blepharitis of right eye 05/08/2011  . Allergic rhinitis 12/07/2010  . GENITAL HERPES 06/22/2009  . CENTRAL SEROUS RETINOPATHY 11/25/2007  . GENITOURINARY DISORDER, MALE 07/03/2007  . RASH AND OTHER NONSPECIFIC SKIN ERUPTION 07/03/2007  . HIV disease ([place]) 05/29/2006  . HERPES ZOSTER 05/29/2006  . 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 CYTOMEGALOVIRAL DISEASE 05/29/2006  . HYPERTRIGLYCERIDEMIA 05/29/2006     Past Surgical History:  Procedure Laterality Date  . HERNIA REPAIR    . INGUINAL HERNIA REPAIR N/A 10/19/2013   Procedure: LAPAROSCOPIC BILATERAL INGUINAL HERNIA REPAIR WITH UMBILICAL HERNIA REPAIR;  Surgeon: Odis Hollingshead, MD;  Location: [facility];  Service: General;  Laterality: N/A;  . INSERTION OF MESH N/A 10/19/2013   Procedure: INSERTION OF MESH;  Surgeon: Odis Hollingshead, MD;  Location: [facility];  Service: General;  Laterality: N/A;  Mesh to bilateral groin and abdomen  . LITHOTRIPSY  2000  . TONSILLECTOMY  1977  . URETEROSCOPY  12/1996       Home Medications    Prior to Admission medications   Medication Sig Start Date End Date Taking? Authorizing Provider  aspirin 81 MG EC tablet Take 81 mg by mouth at bedtime.    Historical Provider, MD  cetirizine (ZYRTEC) 10 MG tablet Take 10 mg by mouth 2 (two) times daily.      Historical Provider, MD  chlorproMAZINE (THORAZINE) 10 MG tablet Take 10 mg by mouth daily as needed for nausea.     Historical Provider, MD  doxycycline (VIBRA-TABS) 100 MG tablet Take 1 tablet (100 mg total) by mouth 2 (two) times daily. 10/26/16 11/02/16  Shary Decamp, PA-C  efavirenz (SUSTIVA) 600 MG tablet Take 1 tablet (600 mg total) by mouth at bedtime. 10/08/16   Campbell Riches, MD  famotidine (PEPCID) 20 MG tablet Take 20 mg by mouth 2 (two) times daily.  Historical Provider, MD  fluocinonide cream (LIDEX) 6.01 % Apply 1 application topically 2 (two) times daily. 10/23/16   Historical Provider, MD  fluticasone (FLONASE) 50 MCG/ACT nasal spray INSTILL 1- 2 SPRAYS IN EACH NOSTRIL EVERY DAY AS NEEDED 05/15/15   Campbell Riches, MD  lamiVUDine-zidovudine (COMBIVIR) 150-300 MG tablet Take 1 tablet by mouth 2 (two) times daily. 10/08/16   Campbell Riches, MD  LORazepam (ATIVAN) 1 MG tablet Take 1 mg by mouth every 8 (eight) hours as needed for anxiety.     Historical Provider, MD  meloxicam (MOBIC) 15 MG tablet Take 15 mg by mouth daily as needed for pain.  08/04/16   Historical Provider, MD  Multiple Vitamin (MULTIVITAMIN WITH MINERALS) TABS tablet Take 2 tablets by mouth daily.    Historical Provider, MD  naproxen sodium (ANAPROX) 220 MG tablet Take 440 mg by mouth 2 (two) times daily with a meal.    Historical Provider, MD  omeprazole (PRILOSEC) 20 MG capsule Take 20 mg by mouth daily.      Historical Provider, MD  Selenium 200 MCG TABS Take 200 mcg by mouth daily.    Historical Provider, MD  SSD 1 % cream Apply 1 application topically 2 (two) times daily. 10/23/16   Historical Provider, MD  valACYclovir (VALTREX) 1000 MG tablet Take 1,000 mg by mouth 2 (two) times daily as needed.    Historical Provider, MD  zolpidem (AMBIEN) 10 MG tablet Take 10 mg by mouth at bedtime as needed for sleep.     Historical Provider, MD    Family History Family History  Problem Relation Age of Onset  . Diabetes Mother   . Hypertension Mother   . Hypothyroidism Mother   . Breast cancer Mother   . Diabetes Father   . Arrhythmia Father   . Benign prostatic hyperplasia Father   . Hypertension Sister     Social History Social History  Substance Use Topics  . Smoking status: Never Smoker  . Smokeless tobacco: Never Used  . Alcohol use 1.0 oz/week    2 Standard drinks or equivalent per week     Comment: occasional     Allergies   Ampicillin   Review of Systems Review of Systems ROS reviewed and all are negative for acute change except as noted in the HPI.  Physical Exam Updated Vital Signs BP (!) 136/103 (BP Location: Right Arm)   Pulse 91   Temp 98.2 F (36.8 C) (Oral)   Resp 18   SpO2 97%   Physical Exam  Constitutional: He is oriented to person, place, and time. He appears well-developed and well-nourished. No distress.  HENT:  Head: Normocephalic and atraumatic.  Eyes: Conjunctivae are normal.  Cardiovascular: Normal rate.   Pulmonary/Chest: Effort normal.  Abdominal: He exhibits no distension.  Neurological: He is alert and  oriented to person, place, and time.  Skin: Skin is warm and dry.  See image below Erythema reducing based on markings. Swelling has reduced significantly from previous visit Distal pulses appreciated Minimal purulence expressed   Psychiatric: He has a normal mood and affect.  Nursing note and vitals reviewed.    ED Treatments / Results  DIAGNOSTIC STUDIES:  Oxygen Saturation is 97% on RA, normal by my interpretation.    COORDINATION OF CARE:  1:12 PM Discussed treatment plan with pt at bedside and pt agreed to plan.  Labs (all labs ordered are listed, but only abnormal results are displayed) Labs Reviewed - No data to display  EKG  EKG Interpretation None      Radiology No results found.  Procedures Procedures (including critical care time)  Medications Ordered in ED Medications - No data to display  Initial Impression / Assessment and Plan / ED Course  I have reviewed the triage vital signs and the nursing notes.  Pertinent labs & imaging results that were available during my care of the patient were reviewed by me and considered in my medical decision making (see chart for details).  Final Clinical Impressions(s) / ED Diagnoses  {I have reviewed the relevant previous healthcare records.  {I obtained HPI from historian. {Patient discussed with supervising physician.  ED Course:  Assessment: Patient returns for check of cellulitis. The region appears to be healing and infection appears to be resolving. Debrided wound in ED. Patient symptoms improved from prior visit. Afebrile and hemodynamically stable. Pt is instructed to continue with home care or antibiotics. Pt has a good understanding of return precautions and is safe for discharge at this time.  Disposition/Plan:  1:18 PM Attempted to call Dermatology office but they were closed. Discussed case with supervising physician  Additional Verbal discharge instructions given and discussed with patient.  Pt  Instructed to f/u with Infectious Disease (HIV follow up)  at scheduled appointment on Wednesday Return precautions given Pt acknowledges and agrees with plan  Supervising Physician Forde Dandy, MD  Final diagnoses:  Visit for wound check    New Prescriptions New Prescriptions   No medications on file    I personally performed the services described in this documentation, which was scribed in my presence. The recorded information has been reviewed and is accurate.    Shary Decamp, PA-C 10/28/16 [place] Liu, MD 10/28/16 1945

## 2016-10-28 NOTE — Discharge Instructions (Signed)
Please read and follow all provided instructions.  Your diagnoses today include:  1. Visit for wound check    Tests performed today include: Vital signs. See below for your results today.   Medications prescribed:  Take as prescribed   Home care instructions:  Follow any educational materials contained in this packet.  Follow-up instructions: Please follow-up with your primary care provider for further evaluation of symptoms and treatment   Return instructions:  Please return to the Emergency Department if you do not get better, if you get worse, or new symptoms OR  - Fever (temperature greater than 101.2F)  - Bleeding that does not stop with holding pressure to the area    -Severe pain (please note that you may be more sore the day after your accident)  - Chest Pain  - Difficulty breathing  - Severe nausea or vomiting  - Inability to tolerate food and liquids  - Passing out  - Skin becoming red around your wounds  - Change in mental status (confusion or lethargy)  - New numbness or weakness    Please return if you have any other emergent concerns.  Additional Information:  Your vital signs today were: BP (!) 136/103 (BP Location: Right Arm)    Pulse 91    Temp 98.2 F (36.8 C) (Oral)    Resp 18    SpO2 97%  If your blood pressure (BP) was elevated above 135/85 this visit, please have this repeated by your doctor within one month. ---------------

## 2016-10-28 NOTE — ED Triage Notes (Signed)
Pt is in for wound re-evaluation. Pt has purulent drainage from wound on l/outer foot

## 2016-10-28 NOTE — Telephone Encounter (Signed)
Called pt and he has wound f/u today.  Swelling has improved. Redness and pain persist.  Feels like there is fluid present, has had minimal drainage.  I offered him appt on wed at 10:30am

## 2016-10-30 ENCOUNTER — Encounter: Payer: Self-pay | Admitting: Infectious Diseases

## 2016-10-30 ENCOUNTER — Ambulatory Visit (INDEPENDENT_AMBULATORY_CARE_PROVIDER_SITE_OTHER): Payer: PRIVATE HEALTH INSURANCE | Admitting: Infectious Diseases

## 2016-10-30 DIAGNOSIS — L03119 Cellulitis of unspecified part of limb: Secondary | ICD-10-CM

## 2016-10-30 DIAGNOSIS — B20 Human immunodeficiency virus [HIV] disease: Secondary | ICD-10-CM | POA: Diagnosis not present

## 2016-10-30 DIAGNOSIS — L02619 Cutaneous abscess of unspecified foot: Secondary | ICD-10-CM

## 2016-10-30 NOTE — Progress Notes (Signed)
   Subjective:    Patient ID: Brendan Price, male    DOB: 16-Aug-1971, 46 y.o.   MRN: [id number]  HPI 46 yo M diagnosed with HIV in 2002.  He remains on his initial ART of CBV/EFV Since his last visit July 2017, he has been seen with kidney stone. Seen twice in last 2 weeks with cellulitis of his L foot after having a wart frozen off his foot. (~10-20-16) He has developed a large blister which then as developed a 3-4 cm open wound.  6 x 2 cm. He pulled skin when removing his sock and blister popped and drained lg amt of clear fluid.  He was seen by derm- put on silvadene cream and  He developed an odor to the wound and then developed a halo of erythema around the wound. He was seen in ED 3-10 and had fluid Cx/stain-  ABUNDANT WBC PRESENT, PREDOMINANTLY PMN  RARE GRAM POSITIVE RODS  RARE GRAM NEGATIVE RODS   He has been on doxy since 3-10. He also got 1 gr of vanco in ED.  It has less erythema (he has an extensive catalogue of photos on his iPad). His swelling has decreased. He was seen in f/u 3-12 and dead skin removed, xeroform dressing placed.  No f/c.  Pain better.  Out of work til today.   HIV 1 RNA Quant (copies/mL)  Date Value  03/06/2016 144 (H)  03/15/2015 <20  05/30/2014 <20   CD4 T Cell Abs (/uL)  Date Value  03/06/2016 840  03/15/2015 740  05/30/2014 810     Review of Systems  Constitutional: Negative for chills and fever.  Gastrointestinal: Negative for blood in stool and constipation.  Genitourinary: Negative for difficulty urinating.  Skin: Positive for wound.  Please see HPI. 12 point ROS o/w (-)      Objective:   Physical Exam  Constitutional: He appears well-developed and well-nourished.  Musculoskeletal:       Feet:         Assessment & Plan:

## 2016-10-30 NOTE — Assessment & Plan Note (Addendum)
spoke with lab- few gnr, staph aureus, await final Will continue doxy for now. Roughly 1 more week. will refer him to wound care center Out of work til next Friday 11-08-16.  Given more xerofrom.  Will see him back in 2 weeks.

## 2016-10-30 NOTE — Assessment & Plan Note (Signed)
Will re-address at next visit.

## 2016-10-31 LAB — AEROBIC CULTURE W GRAM STAIN (SUPERFICIAL SPECIMEN)

## 2016-10-31 LAB — AEROBIC CULTURE  (SUPERFICIAL SPECIMEN)

## 2016-11-01 ENCOUNTER — Encounter: Payer: Self-pay | Admitting: *Deleted

## 2016-11-01 ENCOUNTER — Encounter: Payer: Self-pay | Admitting: Infectious Diseases

## 2016-11-01 ENCOUNTER — Telehealth: Payer: Self-pay | Admitting: *Deleted

## 2016-11-01 MED ORDER — AMOXICILLIN-POT CLAVULANATE 875-125 MG PO TABS: 1.0000 | ORAL_TABLET | Freq: Two times a day (BID) | ORAL | 0 refills | Status: DC

## 2016-11-01 NOTE — Telephone Encounter (Signed)
-----   Message from Campbell Riches, MD sent at 11/01/2016  7:43 AM EDT ----- Regarding: RE: appt That is ok He has a childhood ampicillin allergy. If he can clarify that this was not severe (anaphylactic), augmentin should be ok.  Can you call him and ask to change his medication to augmentin 875mg  bid for 7 days?  And can you send in the rx to his pharmacy?  Thanks  ----- Message ----- From: Georgena Spurling, CMA Sent: 10/31/2016   9:56 AM To: Campbell Riches, MD Subject: FW: appt                                       He was already scheduled for 3/28. Is that ok? ----- Message ----- From: Campbell Riches, MD Sent: 10/31/2016   9:39 AM To: Georgena Spurling, CMA Subject: RE: appt                                       Ok to overbook on Monday  ----- Message ----- From: Georgena Spurling, CMA Sent: 10/30/2016  12:17 PM To: Campbell Riches, MD Subject: appt                                           You do not have anything in two weeks. What day do you want to overbook him? Thanks, Kennyth Lose

## 2016-11-04 ENCOUNTER — Telehealth: Payer: Self-pay | Admitting: *Deleted

## 2016-11-04 ENCOUNTER — Encounter: Payer: Self-pay | Admitting: *Deleted

## 2016-11-04 NOTE — Telephone Encounter (Signed)
error 

## 2016-11-05 ENCOUNTER — Encounter: Payer: PRIVATE HEALTH INSURANCE | Attending: Internal Medicine | Admitting: Internal Medicine

## 2016-11-05 ENCOUNTER — Other Ambulatory Visit: Payer: Self-pay | Admitting: Infectious Diseases

## 2016-11-05 DIAGNOSIS — B20 Human immunodeficiency virus [HIV] disease: Secondary | ICD-10-CM | POA: Insufficient documentation

## 2016-11-05 DIAGNOSIS — K219 Gastro-esophageal reflux disease without esophagitis: Secondary | ICD-10-CM | POA: Diagnosis not present

## 2016-11-05 DIAGNOSIS — L03116 Cellulitis of left lower limb: Secondary | ICD-10-CM | POA: Diagnosis not present

## 2016-11-05 DIAGNOSIS — B070 Plantar wart: Secondary | ICD-10-CM | POA: Insufficient documentation

## 2016-11-05 DIAGNOSIS — Z880 Allergy status to penicillin: Secondary | ICD-10-CM | POA: Diagnosis not present

## 2016-11-05 DIAGNOSIS — L97521 Non-pressure chronic ulcer of other part of left foot limited to breakdown of skin: Secondary | ICD-10-CM | POA: Insufficient documentation

## 2016-11-05 DIAGNOSIS — Z8249 Family history of ischemic heart disease and other diseases of the circulatory system: Secondary | ICD-10-CM | POA: Insufficient documentation

## 2016-11-05 DIAGNOSIS — D649 Anemia, unspecified: Secondary | ICD-10-CM | POA: Diagnosis not present

## 2016-11-05 NOTE — Progress Notes (Signed)
[name], [name] ([id number]) Visit Report for 11/05/2016 Abuse/Suicide Risk Screen Details Patient Name: Brendan Price, Brendan Price. Date of Service: 11/05/2016 9:00 AM Medical Record Number: [id number] Patient Account Number: [id number] Date of Birth/Sex: 11/08/1970 (46 y.o. Male) Treating RN: Brendan Gouty, RN, BSN, Brendan Price Primary Care Brendan Price: Brendan Price Other Clinician: Referring Brendan Price: Brendan Price Treating Balthazar Dooly/Extender: Brendan Price Weeks in Treatment: 0 Abuse/Suicide Risk Screen Items Answer ABUSE/SUICIDE RISK SCREEN: Has anyone close to you tried to hurt or harm you recentlyo No Price you feel uncomfortable with anyone in your familyo No Has anyone forced you Price things that you didnot want to doo No Price you have any thoughts of harming yourselfo No Patient displays signs or symptoms of abuse and/or neglect. No Electronic Signature(s) Signed: 11/05/2016 12:37:08 PM By: Regan Lemming BSN, RN Entered By: Regan Lemming on 11/05/2016 09:12:34 Brendan Price ([id number]) -------------------------------------------------------------------------------- Activities of Daily Living Details Patient Name: Brendan Price Date of Service: 11/05/2016 9:00 AM Medical Record Number: [id number] Patient Account Number: [id number] Date of Birth/Sex: 1970-10-04 (47 y.o. Male) Treating RN: Brendan Gouty, RN, BSN, Brendan Price Primary Care Brendan Price: Brendan Price Other Clinician: Referring Brendan Price: Brendan Price Treating Brendan Price/Extender: Brendan Price Weeks in Treatment: 0 Activities of Daily Living Items Answer Activities of Daily Living (Please select one for each item) Drive Automobile Completely Able Take Medications Completely Able Use Telephone Completely Able Care for Appearance Completely Able Use Toilet Completely Able Bath / Shower Completely Able Dress Self Completely Able Feed Self Completely Able Walk Completely Able Get In / Out Bed Completely Able Housework Completely Able Prepare Meals  Completely [place] for Self Completely Able Electronic Signature(s) Signed: 11/05/2016 12:37:08 PM By: Regan Lemming BSN, RN Entered By: Regan Lemming on 11/05/2016 09:11:14 Brendan Price ([id number]) -------------------------------------------------------------------------------- Education Assessment Details Patient Name: Brendan Price Date of Service: 11/05/2016 9:00 AM Medical Record Number: [id number] Patient Account Number: [id number] Date of Birth/Sex: Nov 17, 1970 (46 y.o. Male) Treating RN: Brendan Gouty, RN, BSN, Brendan Price Primary Care Brendan Price: Brendan Price Other Clinician: Referring Ashritha Desrosiers: Brendan Price Treating Brendan Price/Extender: Brendan Price in Treatment: 0 Primary Learner Assessed: Patient Learning Preferences/Education Level/Primary Language Learning Preference: Explanation Highest Education Level: College or Above Preferred Language: English Cognitive Barrier Assessment/Beliefs Language Barrier: No Physical Barrier Assessment Impaired Vision: No Impaired Hearing: No Decreased Hand dexterity: No Knowledge/Comprehension Assessment Knowledge Level: High Comprehension Level: High Ability to understand written High instructions: Ability to understand verbal High instructions: Motivation Assessment Anxiety Level: Calm Cooperation: Cooperative Education Importance: Acknowledges Need Interest in Health Problems: Asks Questions Perception: Coherent Willingness to Engage in Self- High Management Activities: Readiness to Engage in Self- High Management Activities: Electronic Signature(s) Signed: 11/05/2016 12:37:08 PM By: Regan Lemming BSN, RN Entered By: Regan Lemming on 11/05/2016 09:11:39 Brendan Price ([id number]) -------------------------------------------------------------------------------- Fall Risk Assessment Details Patient Name: Brendan Price Date of Service: 11/05/2016 9:00 AM Medical Record Number:  [id number] Patient Account Number: [id number] Date of Birth/Sex: 11-07-1970 (45 y.o. Male) Treating RN: Brendan Gouty, RN, BSN, [place] Primary Care Kedarius Aloisi: Brendan Price Other Clinician: Referring Anterio Scheel: Brendan Price Treating Brendan Price/Extender: Brendan Price in Treatment: 0 Fall Risk Assessment Items Have you had 2 or more falls in the last 12 monthso 0 No Have you had any fall that resulted in injury in the last 12 monthso 0 No FALL RISK ASSESSMENT: History of falling - immediate or within 3 months 0 No Secondary diagnosis 0 No Ambulatory aid None/bed rest/wheelchair/nurse 0 Yes Crutches/cane/walker 0 No Furniture 0 No  IV Access/Saline Lock 0 No Gait/Training Normal/bed rest/immobile 0 Yes Weak 0 No Impaired 0 No Mental Status Oriented to own ability 0 Yes Electronic Signature(s) Signed: 11/05/2016 12:37:08 PM By: Regan Lemming BSN, RN Entered By: Regan Lemming on 11/05/2016 09:11:54 Brendan Price ([id number]) -------------------------------------------------------------------------------- Foot Assessment Details Patient Name: Brendan Price Date of Service: 11/05/2016 9:00 AM Medical Record Number: [id number] Patient Account Number: [id number] Date of Birth/Sex: December 02, 1970 (46 y.o. Male) Treating RN: Brendan Gouty, RN, BSN, Brendan Price Primary Care Brendan Price: Brendan Price Other Clinician: Referring Brendan Price: Brendan Price Treating Brendan Price/Extender: Brendan Price Weeks in Treatment: 0 Foot Assessment Items Site Locations + = Sensation present, - = Sensation absent, C = Callus, U = Ulcer R = Redness, W = Warmth, M = Maceration, PU = Pre-ulcerative lesion F = Fissure, S = Swelling, D = Dryness Assessment Right: Left: Other Deformity: No No Prior Foot Ulcer: No No Prior Amputation: No No Charcot Joint: No No Ambulatory Status: Ambulatory Without Help Gait: Steady Electronic Signature(s) Signed: 11/05/2016 12:37:08 PM By: Regan Lemming BSN, RN Entered By: Regan Lemming on  11/05/2016 09:12:26 Brendan Price ([id number]) -------------------------------------------------------------------------------- Nutrition Risk Assessment Details Patient Name: Brendan Price Date of Service: 11/05/2016 9:00 AM Medical Record Number: [id number] Patient Account Number: [id number] Date of Birth/Sex: 1970-10-21 (46 y.o. Male) Treating RN: Brendan Gouty, RN, BSN, [place] Primary Care Aquan Kope: Brendan Price Other Clinician: Referring Marjan Rosman: Brendan Price Treating Terriona Horlacher/Extender: Brendan Price Weeks in Treatment: 0 Height (in): 71 Weight (lbs): 174 Body Mass Index (BMI): 24.3 Nutrition Risk Assessment Items NUTRITION RISK SCREEN: I have an illness or condition that made me change the kind and/or 0 No amount of food I eat I eat fewer than two meals per day 0 No I eat few fruits and vegetables, or milk products 0 No I have three or more drinks of beer, liquor or wine almost every day 0 No I have tooth or mouth problems that make it hard for me to eat 0 No I don't always have enough money to buy the food I need 0 No I eat alone most of the time 0 No I take three or more different prescribed or over-the-counter drugs a 0 No day Without wanting to, I have lost or gained 10 pounds in the last six 0 No months I am not always physically able to shop, cook and/or feed myself 0 No Nutrition Protocols Good Risk Protocol 0 No interventions needed Moderate Risk Protocol Electronic Signature(s) Signed: 11/05/2016 12:37:08 PM By: Regan Lemming BSN, RN Entered By: Regan Lemming on 11/05/2016 09:12:12

## 2016-11-06 MED ORDER — AMOXICILLIN-POT CLAVULANATE 875-125 MG PO TABS: 1.0000 | ORAL_TABLET | Freq: Two times a day (BID) | ORAL | 0 refills | Status: DC

## 2016-11-06 NOTE — Progress Notes (Signed)
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 Diabetes: Yes - Father, Mother; Heart Disease: Yes - Father; Hereditary Spherocytosis: No; Hypertension: Yes - Father, Mother; Kidney Disease: No; Lung Disease: No; Seizures: No; Stroke: Yes - Father, Paternal Grandparents, Maternal Grandparents; Thyroid Problems: Yes - Mother; Tuberculosis: No; Never smoker; Marital Status - Single; Alcohol Use: Rarely; Drug Use: Prior History; Caffeine Use: Daily; Financial Concerns: No; Food, Clothing or Shelter Needs: No; Support System Lacking: No; Transportation Concerns: No; Advanced Directives: No; Patient does not want information on Advanced Directives; Living Will: No Electronic Signature(s) Signed: 11/05/2016 12:37:08 PM By: Regan Lemming BSN, RN Signed: 11/05/2016 4:28:03 PM By: Linton Ham MD Entered By: Regan Lemming on 11/05/2016 09:21:53 Brendan Price ([id number]) -------------------------------------------------------------------------------- SuperBill Details Patient Name:  Brendan Price Date of Service: 11/05/2016 Medical Record Number: [id number] Patient Account Number: [id number] Date of Birth/Sex: 1971-02-19 (45 y.o. Male) Treating RN: Baruch Gouty, RN, BSN, Velva Harman Primary Care Provider: Lavone Orn Other Clinician: Referring Provider: HATCHER, JEFFREY Treating Provider/Extender: Ricard Dillon Service Line: Outpatient Weeks in Treatment: 0 Diagnosis Coding ICD-10 Codes Code Description L97.521 Non-pressure chronic ulcer of other part of left foot  limited to breakdown of skin L03.116 Cellulitis of left lower limb B07.0 Plantar wart Facility Procedures CPT4: Description Modifier Quantity Code 20721828 99213 - WOUND CARE VISIT-LEV 3 EST PT 1 CPT4: 83374451 11042 - DEB SUBQ TISSUE 20 SQ CM/< 1 ICD-10 Description Diagnosis L97.521 Non-pressure chronic ulcer of other part of left foot limited to breakdown of skin Physician Procedures CPT4: Description Modifier Quantity Code 4604799 WC PHYS LEVEL 3 o NEW PT 25 1 ICD-10 Description Diagnosis L97.521 Non-pressure chronic ulcer of other part of left foot limited to breakdown of skin L03.116 Cellulitis of left lower limb CPT4: 8721587 27618 - WC PHYS SUBQ TISS 20 SQ CM 1 ICD-10 Description Diagnosis L97.521 Non-pressure chronic ulcer of other part of left foot limited to breakdown of skin Brendan Price, [place] ([id number]) Electronic Signature(s) Signed: 11/05/2016 4:28:03 PM By: Linton Ham MD Entered By: Linton Ham on 11/05/2016 10:34:12

## 2016-11-06 NOTE — Progress Notes (Signed)
ROCHESTER, SERPE ([id number]) Visit Report for 11/05/2016 Allergy List Details Patient Name: Brendan Price, Brendan Price. Date of Service: 11/05/2016 9:00 AM Medical Record Number: [id number] Patient Account Number: [id number] Date of Birth/Sex: 01/02/1971 (46 y.o. Male) Treating RN: Baruch Gouty, RN, BSN, Velva Harman Primary Care Jarnell Cordaro: Lavone Orn Other Clinician: Referring Izeyah Deike: HATCHER, JEFFREY Treating Hamad Whyte/Extender: Ricard Dillon Weeks in Treatment: 0 Allergies Active Allergies ampicillin Allergy Notes Electronic Signature(s) Signed: 11/05/2016 12:37:08 PM By: Regan Lemming BSN, RN Entered By: Regan Lemming on 11/05/2016 09:10:54 Brendan Price ([id number]) -------------------------------------------------------------------------------- [place] Details Patient Name: Brendan Price Date of Service: 11/05/2016 9:00 AM Medical Record Number: [id number] Patient Account Number: [id number] Date of Birth/Sex: Aug 07, 1971 (46 y.o. Male) Treating RN: Baruch Gouty, RN, BSN, Velva Harman Primary Care Jashanti Clinkscale: Lavone Orn Other Clinician: Referring Janathan Bribiesca: HATCHER, JEFFREY Treating Daja Shuping/Extender: Tito Dine in Treatment: 0 Visit Information Patient Arrived: Ambulatory Arrival Time: 09:06 Accompanied By: self Transfer Assistance: None Patient Identification Verified: Yes Secondary Verification Process Yes Completed: Patient Requires Transmission-Based No Precautions: Patient Has Alerts: No Electronic Signature(s) Signed: 11/05/2016 12:37:08 PM By: Regan Lemming BSN, RN Entered By: Regan Lemming on 11/05/2016 [place], [place]. ([id number]) -------------------------------------------------------------------------------- Clinic Level of Care Assessment Details Patient Name: Brendan Price Date of Service: 11/05/2016 9:00 AM Medical Record Number: [id number] Patient Account Number: [id number] Date of Birth/Sex: 1971-04-26 (47 y.o. Male) Treating RN: Baruch Gouty, RN, BSN,  Velva Harman Primary Care Shonice Wrisley: Lavone Orn Other Clinician: Referring Mckale Haffey: HATCHER, JEFFREY Treating Johnella Crumm/Extender: Tito Dine in Treatment: 0 Clinic Level of Care Assessment Items TOOL 1 Quantity Score []  - Use when EandM and Procedure is performed on INITIAL visit 0 ASSESSMENTS - Nursing Assessment / Reassessment X - General Physical Exam (combine w/ comprehensive assessment (listed just 1 20 below) when performed on new pt. evals) X - Comprehensive Assessment (HX, ROS, Risk Assessments, Wounds Hx, etc.) 1 25 ASSESSMENTS - Wound and Skin Assessment / Reassessment []  - Dermatologic / Skin Assessment (not related to wound area) 0 ASSESSMENTS - Ostomy and/or Continence Assessment and Care []  - Incontinence Assessment and Management 0 []  - Ostomy Care Assessment and Management (repouching, etc.) 0 PROCESS - Coordination of Care X - Simple Patient / Family Education for ongoing care 1 15 []  - Complex (extensive) Patient / Family Education for ongoing care 0 X - Staff obtains Consents, Records, Test Results / Process Orders 1 10 []  - Staff telephones HHA, Nursing Homes / Clarify orders / etc 0 []  - Routine Transfer to another Facility (non-emergent condition) 0 []  - Routine Hospital Admission (non-emergent condition) 0 X - New Admissions / Biomedical engineer / Ordering NPWT, Apligraf, etc. 1 15 []  - Emergency Hospital Admission (emergent condition) 0 PROCESS - Special Needs []  - Pediatric / Minor Patient Management 0 []  - Isolation Patient Management 0 ALVER, LEETE. ([id number]) []  - Hearing / Language / Visual special needs 0 []  - Assessment of Community assistance (transportation, D/C planning, etc.) 0 []  - Additional assistance / Altered mentation 0 []  - Support Surface(s) Assessment (bed, cushion, seat, etc.) 0 INTERVENTIONS - Miscellaneous []  - External ear exam 0 []  - Patient Transfer (multiple staff / Civil Service fast streamer / Similar devices) 0 []  - Simple  Staple / Suture removal (25 or less) 0 []  - Complex Staple / Suture removal (26 or more) 0 []  - Hypo/Hyperglycemic Management (Price not check if billed separately) 0 X - Ankle / Brachial Index (ABI) - Price not check if billed separately 1 15 Has the patient been  seen at the hospital within the last three years: Yes Total Score: 100 Level Of Care: New/Established - Level 3 Electronic Signature(s) Signed: 11/05/2016 12:37:08 PM By: Regan Lemming BSN, RN Entered By: Regan Lemming on 11/05/2016 09:54:26 Brendan Price ([id number]) -------------------------------------------------------------------------------- Encounter Discharge Information Details Patient Name: Brendan Price Date of Service: 11/05/2016 9:00 AM Medical Record Number: [id number] Patient Account Number: [id number] Date of Birth/Sex: 09-15-1970 (46 y.o. Male) Treating RN: Baruch Gouty, RN, BSN, Velva Harman Primary Care Doak Mah: Lavone Orn Other Clinician: Referring Rollen Selders: HATCHER, JEFFREY Treating Maycie Luera/Extender: Tito Dine in Treatment: 0 Encounter Discharge Information Items Schedule Follow-up Appointment: No Medication Reconciliation completed No and provided to Patient/Care Hiyab Nhem: Provided on Clinical Summary of Care: 11/05/2016 Form Type Recipient Paper Patient [place] Electronic Signature(s) Signed: 11/05/2016 10:13:11 AM By: Ruthine Dose Entered By: Ruthine Dose on 11/05/2016 10:13:11 Brendan Price ([id number]) -------------------------------------------------------------------------------- Lower Extremity Assessment Details Patient Name: Brendan Price Date of Service: 11/05/2016 9:00 AM Medical Record Number: [id number] Patient Account Number: [id number] Date of Birth/Sex: Jan 15, 1971 (46 y.o. Male) Treating RN: Afful, RN, BSN, [place] Primary Care Malana Eberwein: Lavone Orn Other Clinician: Referring Schwanda Zima: HATCHER, JEFFREY Treating Dakayla Disanti/Extender: Ricard Dillon Weeks in Treatment: 0 Edema  Assessment Assessed: [Left: No] [Right: No] E[Left: dema] [Right: :] Calf Left: Right: Point of Measurement: 39 cm From Medial Instep 34.5 cm 34.5 cm Ankle Left: Right: Point of Measurement: 9 cm From Medial Instep 21.5 cm 21.5 cm Vascular Assessment Claudication: Claudication Assessment [Left:None] [Right:None] Pulses: Dorsalis Pedis Palpable: [Left:Yes] [Right:Yes] Posterior Tibial Palpable: [Left:Yes] [Right:Yes] Extremity colors, hair growth, and conditions: Extremity Color: [Left:Normal] [Right:Normal] Hair Growth on Extremity: [Left:Yes] [Right:Yes] Temperature of Extremity: [Left:Warm] [Right:Warm] Capillary Refill: [Left:< 3 seconds] [Right:< 3 seconds] Blood Pressure: Brachial: [Left:124] [Right:128] Dorsalis Pedis: 112 [Left:Dorsalis Pedis: 814] Ankle: Posterior Tibial: [Left:Posterior Tibial: 0.88] [Right:0.90] Toe Nail Assessment Left: Right: Thick: No No Discolored: No No Deformed: No No Improper Length and Hygiene: No No MAGDALENO, LORTIE ([id number]) Electronic Signature(s) Signed: 11/05/2016 12:37:08 PM By: Regan Lemming BSN, RN Entered By: Regan Lemming on 11/05/2016 09:35:27 Brendan Price ([id number]) -------------------------------------------------------------------------------- Multi Wound Chart Details Patient Name: Brendan Price Date of Service: 11/05/2016 9:00 AM Medical Record Number: [id number] Patient Account Number: [id number] Date of Birth/Sex: 06/23/1971 (46 y.o. Male) Treating RN: Baruch Gouty, RN, BSN, Velva Harman Primary Care Cinque Begley: Lavone Orn Other Clinician: Referring Arlo Buffone: HATCHER, JEFFREY Treating Epsie Walthall/Extender: Ricard Dillon Weeks in Treatment: 0 Vital Signs Height(in): 71 Pulse(bpm): 74 Weight(lbs): 174 Blood Pressure 126/84 (mmHg): Body Mass Index(BMI): 24 Temperature(F): 95 Respiratory Rate 18 (breaths/min): Photos: [1:No Photos] [N/A:N/A] Wound Location: [1:Left Foot - Lateral] [N/A:N/A] Wounding Event:  [1:Gradually Appeared] [N/A:N/A] Primary Etiology: [1:Cyst] [N/A:N/A] Comorbid History: [1:Anemia, Human Immunodeficiency Virus, History of Burn] [N/A:N/A] Date Acquired: [1:07/23/2016] [N/A:N/A] Weeks of Treatment: [1:0] [N/A:N/A] Wound Status: [1:Open] [N/A:N/A] Measurements L x W x D 1.7x6x0.1 [N/A:N/A] (cm) Area (cm) : [1:8.011] [N/A:N/A] Volume (cm) : [1:0.801] [N/A:N/A] % Reduction in Area: [1:0.00%] [N/A:N/A] % Reduction in Volume: 0.00% [N/A:N/A] Classification: [1:Full Thickness Without Exposed Support Structures] [N/A:N/A] Exudate Amount: [1:Medium] [N/A:N/A] Exudate Type: [1:Serosanguineous] [N/A:N/A] Exudate Color: [1:red, brown] [N/A:N/A] Wound Margin: [1:Flat and Intact] [N/A:N/A] Granulation Amount: [1:Medium (34-66%)] [N/A:N/A] Granulation Quality: [1:Pink, Pale] [N/A:N/A] Necrotic Amount: [1:Medium (34-66%)] [N/A:N/A] Exposed Structures: [1:Fat Layer (Subcutaneous Tissue) Exposed: Yes Fascia: No Tendon: No] [N/A:N/A] Muscle: No Joint: No Bone: No Epithelialization: None N/A N/A Periwound Skin Texture: Excoriation: No N/A N/A Induration: No Callus: No Crepitus: No Rash: No Scarring: No Periwound Skin Maceration: Yes N/A  N/A Moisture: Dry/Scaly: No Periwound Skin Color: Atrophie Blanche: No N/A N/A Cyanosis: No Ecchymosis: No Erythema: No Hemosiderin Staining: No Mottled: No Pallor: No Rubor: No Temperature: No Abnormality N/A N/A Tenderness on No N/A N/A Palpation: Wound Preparation: Ulcer Cleansing: N/A N/A Rinsed/Irrigated with Saline Topical Anesthetic Applied: Other: lidocaine 4 % Treatment Notes Electronic Signature(s) Signed: 11/05/2016 12:37:08 PM By: Regan Lemming BSN, RN Entered By: Regan Lemming on 11/05/2016 09:41:41 Brendan Price ([id number]) -------------------------------------------------------------------------------- [place] Details Patient Name: Brendan Price Date of Service: 11/05/2016 9:00  AM Medical Record Number: [id number] Patient Account Number: [id number] Date of Birth/Sex: 08-27-1970 (46 y.o. Male) Treating RN: Baruch Gouty, RN, BSN, Velva Harman Primary Care Jakeim Sedore: Lavone Orn Other Clinician: Referring Zurie Platas: HATCHER, JEFFREY Treating Dolph Tavano/Extender: Tito Dine in Treatment: 0 Active Inactive ` Orientation to the Wound Care Program Nursing Diagnoses: Knowledge deficit related to the wound healing center program Goals: Patient/caregiver will verbalize understanding of the [place] Program Date Initiated: 11/05/2016 Target Resolution Date: 03/07/2017 Goal Status: Active Interventions: Provide education on orientation to the wound center Notes: ` Wound/Skin Impairment Nursing Diagnoses: Impaired tissue integrity Knowledge deficit related to ulceration/compromised skin integrity Goals: Ulcer/skin breakdown will have a volume reduction of 30% by week 4 Date Initiated: 11/05/2016 Target Resolution Date: 03/07/2017 Goal Status: Active Ulcer/skin breakdown will have a volume reduction of 50% by week 8 Date Initiated: 11/05/2016 Target Resolution Date: 03/07/2017 Goal Status: Active Ulcer/skin breakdown will have a volume reduction of 80% by week 12 Date Initiated: 11/05/2016 Target Resolution Date: 03/07/2017 Goal Status: Active Ulcer/skin breakdown will heal within 14 weeks Date Initiated: 11/05/2016 Target Resolution Date: 03/07/2017 Goal Status: Active BERDELL, HOSTETLER ([id number]) Interventions: Assess patient/caregiver ability to obtain necessary supplies Assess patient/caregiver ability to perform ulcer/skin care regimen upon admission and as needed Assess ulceration(s) every visit Provide education on ulcer and skin care Treatment Activities: Referred to DME Cabell Lazenby for dressing supplies : 11/05/2016 Skin care regimen initiated : 11/05/2016 Topical wound management initiated : 11/05/2016 Notes: Electronic Signature(s) Signed:  11/05/2016 12:37:08 PM By: Regan Lemming BSN, RN Entered By: Regan Lemming on 11/05/2016 09:41:24 Brendan Price ([id number]) -------------------------------------------------------------------------------- Pain Assessment Details Patient Name: Brendan Price Date of Service: 11/05/2016 9:00 AM Medical Record Number: [id number] Patient Account Number: [id number] Date of Birth/Sex: 10/13/1970 (46 y.o. Male) Treating RN: Baruch Gouty, RN, BSN, Velva Harman Primary Care Desteni Piscopo: Lavone Orn Other Clinician: Referring Brysin Towery: HATCHER, JEFFREY Treating Ryana Montecalvo/Extender: Ricard Dillon Weeks in Treatment: 0 Active Problems Location of Pain Severity and Description of Pain Patient Has Paino No Site Locations With Dressing Change: No Pain Management and Medication Current Pain Management: Electronic Signature(s) Signed: 11/05/2016 12:37:08 PM By: Regan Lemming BSN, RN Entered By: Regan Lemming on 11/05/2016 09:07:19 Brendan Price ([id number]) -------------------------------------------------------------------------------- Wound Assessment Details Patient Name: Brendan Price Date of Service: 11/05/2016 9:00 AM Medical Record Number: [id number] Patient Account Number: [id number] Date of Birth/Sex: 1970/10/03 (46 y.o. Male) Treating RN: Afful, RN, BSN, [place] Primary Care Argyle Gustafson: Lavone Orn Other Clinician: Referring Syliva Mee: HATCHER, JEFFREY Treating Meghanne Pletz/Extender: Ricard Dillon Weeks in Treatment: 0 Wound Status Wound Number: 1 Primary Cyst Etiology: Wound Location: Left Foot - Lateral Wound Open Wounding Event: Gradually Appeared Status: Date Acquired: 07/23/2016 Comorbid Anemia, Human Immunodeficiency Weeks Of Treatment: 0 History: Virus, History of Burn Clustered Wound: No Photos Photo Uploaded By: Regan Lemming on 11/05/2016 11:29:15 Wound Measurements Length: (cm) 1.7 Width: (cm) 6 Depth: (cm) 0.1 Area: (cm) 8.011 Volume: (cm) 0.801 % Reduction in Area:  0% %  Reduction in Volume: 0% Epithelialization: None Tunneling: No Undermining: No Wound Description Full Thickness Without Exposed Classification: Support Structures Wound Margin: Flat and Intact Exudate Medium Amount: HAMZEH, TALL ([id number]) Foul Odor After Cleansing: No Slough/Fibrino Yes Exudate Type: Serosanguineous Exudate Color: red, brown Wound Bed Granulation Amount: Medium (34-66%) Exposed Structure Granulation Quality: Pink, Pale Fascia Exposed: No Necrotic Amount: Medium (34-66%) Fat Layer (Subcutaneous Tissue) Exposed: Yes Necrotic Quality: Adherent Slough Tendon Exposed: No Muscle Exposed: No Joint Exposed: No Bone Exposed: No Periwound Skin Texture Texture Color No Abnormalities Noted: No No Abnormalities Noted: No Callus: No Atrophie Blanche: No Crepitus: No Cyanosis: No Excoriation: No Ecchymosis: No Induration: No Erythema: No Rash: No Hemosiderin Staining: No Scarring: No Mottled: No Pallor: No Moisture Rubor: No No Abnormalities Noted: No Dry / Scaly: No Temperature / Pain Maceration: Yes Temperature: No Abnormality Wound Preparation Ulcer Cleansing: Rinsed/Irrigated with Saline Topical Anesthetic Applied: Other: lidocaine 4 %, Treatment Notes Wound #1 (Left, Lateral Foot) 1. Cleansed with: Clean wound with Normal Saline 4. Dressing Applied: Santyl Ointment 5. Secondary Dressing Applied Gauze and Kerlix/Conform 7. Secured with Recruitment consultant) Signed: 11/05/2016 12:37:08 PM By: Regan Lemming BSN, RN Entered By: Regan Lemming on 11/05/2016 09:25:52 Brendan Price ([id number]) -------------------------------------------------------------------------------- [place] Details Patient Name: Brendan Price Date of Service: 11/05/2016 9:00 AM Medical Record Number: [id number] Patient Account Number: [id number] Date of Birth/Sex: 1971-02-15 (46 y.o. Male) Treating RN: Afful, RN, BSN, [place] Primary Care Jazell Rosenau: Lavone Orn Other Clinician: Referring Charyl Minervini: HATCHER, JEFFREY Treating Muaz Shorey/Extender: Tito Dine in Treatment: 0 Vital Signs Time Taken: 09:07 Temperature (F): 95 Height (in): 71 Pulse (bpm): 74 Source: Stated Respiratory Rate (breaths/min): 18 Weight (lbs): 174 Blood Pressure (mmHg): 126/84 Source: Measured Reference Range: 80 - 120 mg / dl Body Mass Index (BMI): 24.3 Electronic Signature(s) Signed: 11/05/2016 12:37:08 PM By: Regan Lemming BSN, RN Entered By: Regan Lemming on 11/05/2016 09:10:27

## 2016-11-12 ENCOUNTER — Encounter: Payer: PRIVATE HEALTH INSURANCE | Admitting: Internal Medicine

## 2016-11-12 DIAGNOSIS — L97521 Non-pressure chronic ulcer of other part of left foot limited to breakdown of skin: Secondary | ICD-10-CM | POA: Diagnosis not present

## 2016-11-13 ENCOUNTER — Encounter: Payer: Self-pay | Admitting: Infectious Diseases

## 2016-11-13 ENCOUNTER — Ambulatory Visit (INDEPENDENT_AMBULATORY_CARE_PROVIDER_SITE_OTHER): Payer: PRIVATE HEALTH INSURANCE | Admitting: Infectious Diseases

## 2016-11-13 VITALS — BP 135/83 | HR 96 | Temp 98.3°F | Ht 71.0 in | Wt 180.0 lb

## 2016-11-13 DIAGNOSIS — L02619 Cutaneous abscess of unspecified foot: Secondary | ICD-10-CM

## 2016-11-13 DIAGNOSIS — Z23 Encounter for immunization: Secondary | ICD-10-CM | POA: Diagnosis not present

## 2016-11-13 DIAGNOSIS — L03119 Cellulitis of unspecified part of limb: Secondary | ICD-10-CM

## 2016-11-13 DIAGNOSIS — B20 Human immunodeficiency virus [HIV] disease: Secondary | ICD-10-CM
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
## 2016-11-13 NOTE — Assessment & Plan Note (Addendum)
Stable, will f/u at his next visit.  Offer mening vax Has been in stable relationship for last 3 years.    HIV 1 RNA Quant (copies/mL)  Date Value  03/06/2016 144 (H)  03/15/2015 <20  05/30/2014 <20   CD4 T Cell Abs (/uL)  Date Value  03/06/2016 840  03/15/2015 740  05/30/2014 810

## 2016-11-13 NOTE — Progress Notes (Signed)
Brendan, Price ([id number]) Visit Report for 11/12/2016 Chief Complaint Document Details Patient Name: Brendan Price, Brendan Price. Date of Service: 11/12/2016 10:30 AM Medical Record Number: [id number] Patient Account Number: [id number] Date of Birth/Sex: February 07, 1971 (46 y.o. Male) Treating RN: Baruch Gouty, RN, BSN, Velva Harman Primary Care Provider: Lavone Orn Other Clinician: Referring Provider: Lavone Orn Treating Provider/Extender: Tito Dine in Treatment: 1 Information Obtained from: Patient Chief Complaint 11/05/16; patient is here for review of a wound on the lateral aspect of his left foot Electronic Signature(s) Signed: 11/13/2016 7:58:45 AM By: Linton Ham MD Entered By: Linton Ham on 11/12/2016 11:18:10 Brendan Price ([id number]) -------------------------------------------------------------------------------- Debridement Details Patient Name: Brendan Price Date of Service: 11/12/2016 10:30 AM Medical Record Number: [id number] Patient Account Number: [id number] Date of Birth/Sex: December 03, 1970 (46 y.o. Male) Treating RN: Afful, RN, BSN, [place] Primary Care Provider: Lavone Orn Other Clinician: Referring Provider: Lavone Orn Treating Provider/Extender: Tito Dine in Treatment: 1 Debridement Performed for Wound #1 Left,Lateral Foot Assessment: Performed By: Physician Ricard Dillon, MD Debridement: Debridement Pre-procedure Yes - 10:30 Verification/Time Out Taken: Start Time: 10:30 Pain Control: Lidocaine 4% Topical Solution Level: Skin/Subcutaneous Tissue Total Area Debrided (L x 1.5 (cm) x 6 (cm) = 9 (cm) W): Tissue and other Non-Viable, Fibrin/Slough, Subcutaneous material debrided: Instrument: Curette Bleeding: Minimum Hemostasis Achieved: Pressure End Time: 10:34 Procedural Pain: 0 Post Procedural Pain: 0 Response to Treatment: Procedure was tolerated well Post Debridement Measurements of Total Wound Length: (cm) 1.5 Width: (cm)  6 Depth: (cm) 0.1 Volume: (cm) 0.707 Character of Wound/Ulcer Post Requires Further Debridement Debridement: Severity of Tissue Post Debridement: Fat layer exposed Post Procedure Diagnosis Same as Pre-procedure Electronic Signature(s) Signed: 11/12/2016 4:40:29 PM By: Regan Lemming BSN, RN Signed: 11/13/2016 7:58:45 AM By: Linton Ham MD Entered By: Linton Ham on 11/12/2016 11:18:02 Brendan Price ([name] ([id number]) -------------------------------------------------------------------------------- HPI Details Patient Name: Brendan Price Date of Service: 11/12/2016 10:30 AM Medical Record Number: [id number] Patient Account Number: [id number] Date of Birth/Sex: November 27, 1970 (46 y.o. 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 He states his problem began in December 2017 with a plantar wart on the lateral aspect of his left midfoot. He had a application of liquid nitrogen by Dr. Allyson Sabal his dermatologist. Then subsequently he used some form of topical agent which caused a large blister and denuded skin in this area. For a while he was using Neosporin on this area. Sometime in early March she started developing erythema and pain and he was seen in the emergency room on 3/10 what looks like extensive cellulitis on the lateral to mid part of his foot extending into his lower lateral ankle. At that point over the now lateral foot there was a necrotic surface noted by the pictures and Epic. He was given IV antibiotics in the ER and I think Dr. Johnnye Sima gave him doxycycline. Culture from 3/12 showed methicillin sensitive staph aureus and he is been on Augmentin for about 4 days now. The erythema here  is considerably better. Using Xeroform to the wound. The patient is not a diabetic and does not smoke. No relevant wound history that I'm aware of. 11/12/16; wound bed is improved. There is no erythema around the wound, I extended his Augmentin last week but I think that  can stop now. He has been using Santyl. Electronic Signature(s) Signed: 11/13/2016 7:58:45 AM By: Linton Ham MD Entered By: Linton Ham on 11/12/2016 11:19:19 Brendan Price ([id number]) -------------------------------------------------------------------------------- Physical Exam Details Patient Name: Brendan Price Date of Service: 11/12/2016 10:30 AM Medical Record Number: [id number] Patient Account Number: [id number] Date of Birth/Sex: Jul 09, 1971 (46 y.o. Male) Treating RN: Baruch Gouty, RN, BSN, Velva Harman Primary Care Provider: Lavone Orn Other Clinician: Referring Provider: Lavone Orn Treating Provider/Extender: Tito Dine in Treatment: 1 Constitutional Patient is hypertensive.. Pulse regular and within target range for patient.Marland Kitchen Respirations regular, non-labored and within target range.. Temperature is normal and within the target range for the patient.. Patient's appearance is neat and clean. Appears in no acute distress. Well nourished and well developed.. Cardiovascular Pedal pulses palpable and strong bilaterally.. Notes Wound is on the lateral aspect of his foot. Linear wound. Surface looks much better than last week although still required debridement with a #3 curet. Post debridement the granulation looks quite healthy. Hemostasis with direct pressure. There is no surrounding erythema and a think the antibiotics can stop when he runs out of the current supply [Augmentin]. Electronic Signature(s) Signed: 11/13/2016 7:58:45 AM By: Linton Ham MD Entered By: Linton Ham on 11/12/2016 11:20:27 Brendan Price  ([id number]) -------------------------------------------------------------------------------- Physician Orders Details Patient Name: Brendan Price Date of Service: 11/12/2016 10:30 AM Medical Record Number: [id number] Patient Account Number: [id number] Date of Birth/Sex: Oct 26, 1970 (46 y.o. Male) Treating RN: Baruch Gouty, RN, BSN, [place] Primary Care Provider: Lavone Orn Other Clinician: Referring Provider: Lavone Orn Treating Provider/Extender: Tito Dine in Treatment: 1 Verbal / Phone Orders: No Diagnosis Coding Wound Cleansing Wound #1 Left,Lateral Foot o Cleanse wound with mild soap and water Anesthetic Wound #1 Left,Lateral Foot o Topical Lidocaine 4% cream applied to wound bed prior to debridement Primary Wound Dressing Wound #1 Left,Lateral Foot o Santyl Ointment Secondary Dressing Wound #1 Left,Lateral Foot o Gauze and Kerlix/Conform Dressing Change Frequency Wound #1 Left,Lateral Foot o Change dressing every day. Follow-up Appointments Wound #1 Left,Lateral Foot o Return Appointment in 1 week. Additional Orders / Instructions Wound #1 Left,Lateral Foot o Increase protein intake. o Activity as tolerated Electronic Signature(s) Signed: 11/12/2016 4:40:29 PM By: Regan Lemming BSN, RN Signed: 11/13/2016 7:58:45 AM By: Linton Ham MD Entered By: Regan Lemming on 11/12/2016 10:32:34 Brendan Price ([name] ([id number]) -------------------------------------------------------------------------------- Problem List Details Patient Name: Brendan Price, Brendan Price Date of Service: 11/12/2016 10:30 AM Medical Record Number: [id number] Patient Account Number: [id number] Date of Birth/Sex: 09/23/1970 (46 y.o. 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 He states his problem began in December 2017 with a plantar wart on the lateral aspect of his left midfoot. He had a application of liquid nitrogen by Dr. Allyson Sabal his dermatologist. Then subsequently he used some form of topical agent which caused a large blister and denuded skin in this area. For a while he was using Neosporin on this area. Sometime in early March she started developing erythema and pain and he was seen in the emergency room on 3/10 what looks like extensive cellulitis on the lateral to mid part of his foot extending into his lower lateral ankle. At that point over the now lateral foot there was a necrotic surface noted by the pictures and Epic. He was given IV antibiotics in the ER and I think Dr. Johnnye Sima gave  him doxycycline. Culture from 3/12 showed methicillin sensitive staph aureus and he is been on Augmentin for about 4 days now. The erythema here is considerably better. Using Xeroform to the wound. The patient is not a diabetic and does not smoke. No relevant wound history that I'm aware of. 11/12/16; wound bed is improved. There is no erythema around the wound, I extended his Augmentin last week but I think that can stop now. He has been using Santyl. Objective Constitutional Patient is hypertensive.. Pulse regular and within target range for patient.Marland Kitchen Respirations regular, non-labored and within target range.. Temperature is normal and within the target range for the patient.. Patient's appearance is neat and clean. Appears in no acute distress. Well nourished and well developed.. Vitals Time Taken: 10:17 AM, Height: 71 in, Weight: 174 lbs, BMI: 24.3, Temperature: 97.9 F, Pulse: 92 bpm, Respiratory Rate: 18 breaths/min, Blood Pressure: 133/100 mmHg. Cardiovascular Pedal pulses palpable and strong bilaterally.Marland Kitchen Brendan Price, Brendan Price ([id number]) General Notes: Wound is on the lateral aspect of his foot. Linear wound. Surface looks much better than last week although still required debridement with a #3 curet. Post debridement the granulation looks quite healthy. Hemostasis with direct pressure. There is no surrounding erythema and a think the antibiotics can stop when he runs out of the current supply [Augmentin]. Integumentary (Hair, Skin) Wound #1 status is Open. Original cause of wound was Gradually Appeared. The wound is located on the Left,Lateral Foot. The wound measures 1.5cm length x 6cm width x 0.1cm depth; 7.069cm^2 area and 0.707cm^3 volume. There is Fat Layer (Subcutaneous Tissue) Exposed exposed. There is no tunneling or undermining noted. There is a medium amount of serosanguineous drainage noted. The wound margin is flat and intact. There is medium (34-66%) pink, pale granulation  within the wound bed. There is a medium (34-66%) amount of necrotic tissue within the wound bed including Adherent Slough. The periwound skin appearance did not exhibit: Callus, Crepitus, Excoriation, Induration, Rash, Scarring, Dry/Scaly, Maceration, Atrophie Blanche, Cyanosis, Ecchymosis, Hemosiderin Staining, Mottled, Pallor, Rubor, Erythema. Periwound temperature was noted as No Abnormality. 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 There was a Skin/Subcutaneous Tissue Debridement ([id number]) debridement with total area of 9 sq cm performed by Ricard Dillon, MD. with the following instrument(s): Curette to remove Non-Viable tissue/material including Fibrin/Slough and Subcutaneous after achieving pain control using Lidocaine  4% Topical Solution. A time out was conducted at 10:30, prior to the start of the procedure. A Minimum amount of bleeding was controlled with Pressure. The procedure was tolerated well with a pain level of 0 throughout and a pain level of 0 following the procedure. Post Debridement Measurements: 1.5cm length x 6cm width x 0.1cm depth; 0.707cm^3 volume. Character of Wound/Ulcer Post Debridement requires further debridement. Severity of Tissue Post Debridement is: Fat layer exposed. Post procedure Diagnosis Wound #1: Same as Pre-Procedure Brendan Price, Brendan Price ([id number]) Plan Wound Cleansing: Wound #1 Left,Lateral Foot: Cleanse wound with mild soap and water Anesthetic: Wound #1 Left,Lateral Foot: Topical Lidocaine 4% cream applied to wound bed prior to debridement Primary Wound Dressing: Wound #1 Left,Lateral Foot: Santyl Ointment Secondary Dressing: Wound #1 Left,Lateral Foot: Gauze and Kerlix/Conform Dressing Change Frequency: Wound #1  Left,Lateral Foot: Change dressing every day. Follow-up Appointments: Wound #1 Left,Lateral Foot: Return Appointment in 1 week. Additional Orders / Instructions: Wound #1 Left,Lateral Foot: Increase protein intake. Activity as tolerated #1 wound is improved versus last week slightly smaller but without much healthier looking base. Stool debrided today and I still think we should continue Santyl ointment that he is changing daily at home #2 extensive discussion about the patient returning to work. He works as a Software engineer at [employer]. He would like to return to work next week although his employer has been nice enough to give him a modified duties so that he doesn't have to be on his foot. I think this is satisfactory. The wound is not on the plantar aspect of his foot #3 the extensive cellulitis that this patient had which was the cause of all of this now appears to have resolved. I see no evidence he requires any more antibiotics at this point Electronic Signature(s) Signed: 11/13/2016 7:58:45 AM By: Linton Ham MD Entered By: Linton Ham on 11/12/2016 11:21:56 Brendan Price ([id number]) Brendan Price ([id number]) -------------------------------------------------------------------------------- [place] Details Patient Name: Brendan Price Date of Service: 11/12/2016 Medical Record Number: [id number] Patient Account Number: [id number] Date of Birth/Sex: January 22, 1971 (46 y.o. Male) Treating RN: Baruch Gouty, RN, BSN, Velva Harman Primary Care Provider: Lavone Orn Other Clinician: Referring Provider: Lavone Orn Treating Provider/Extender: Tito Dine in Treatment: 1 Diagnosis Coding ICD-10 Codes Code Description [phone] Non-pressure chronic ulcer of other part of left foot limited to breakdown of skin L03.116 Cellulitis of left lower limb B07.0 Plantar wart Facility Procedures CPT4: Description Modifier Quantity Code [id number] 11042 - DEB SUBQ TISSUE 20 SQ  CM/< 1 ICD-10 Description Diagnosis L97.521 Non-pressure chronic ulcer of other part of left foot limited to breakdown of skin Physician Procedures CPT4: Description Modifier Quantity Code [id number] 06269 - WC PHYS SUBQ TISS 20 SQ CM 1 ICD-10 Description Diagnosis L97.521 Non-pressure chronic ulcer of other part of left foot limited to breakdown of skin Electronic Signature(s) Signed: 11/13/2016 7:58:45 AM By: Linton Ham MD Entered By: Linton Ham on 11/12/2016 11:22:24

## 2016-11-13 NOTE — Progress Notes (Signed)
   Subjective:    Patient ID: Brendan Price, male    DOB: [birth year], 46 y.o.   MRN: [id number]  HPI 46 yo M diagnosed with HIV in 2002. He remains on his initial ART of CBV/EFV Since his last visit July 2017, he has been seen with kidney stone. Seen twice in last 2 weeks with cellulitis of his L foot after having a wart frozen off his foot. (~10-20-16) He has developed a large blister which then as developed a 3-4 cm open wound.  6 x 2 cm. He pulled skin when removing his sock and blister popped and drained lg amt of clear fluid.  He was seen by derm- put on silvadene cream and  He developed an odor to the wound and then developed a halo of erythema around the wound. He was seen in ED 3-10 and had fluid Cx/stain- H parainfluenza, MSSA.  He got 1 gr of vanco in ED and was d/c on doxy.  He was seen in ID on 3-14, changed to augmentin on 3-16 for an additional week.  He has been seen in [place].  Wound is improving, decreased size. No proximal streaking.   Review of Systems See above- decreased size of wound. No streaking. No f/c.  No d/c.      Objective:   Physical Exam  Constitutional: He appears well-developed and well-nourished.  Musculoskeletal:       Feet:  Psychiatric: He has a normal mood and affect.            Assessment & Plan:

## 2016-11-13 NOTE — Assessment & Plan Note (Signed)
He is doing well Will complete anbx Will call if any change in wound. Greatly appreciate WOC f/u.  Will see him back as regulalry scheduled unless acute issues.

## 2016-11-13 NOTE — Addendum Note (Signed)
Addended by: Rodman Key A on: 11/13/2016 12:10 PM   Modules accepted: Orders

## 2016-11-15 ENCOUNTER — Encounter: Payer: Self-pay | Admitting: Infectious Diseases

## 2016-11-15 ENCOUNTER — Other Ambulatory Visit: Payer: Self-pay | Admitting: Infectious Diseases

## 2016-11-18 ENCOUNTER — Encounter: Payer: Self-pay | Admitting: Infectious Diseases

## 2016-11-18 DIAGNOSIS — L089 Local infection of the skin and subcutaneous tissue, unspecified: Secondary | ICD-10-CM

## 2016-11-18 DIAGNOSIS — T148XXA Other injury of unspecified body region, initial encounter: Principal | ICD-10-CM

## 2016-11-19 ENCOUNTER — Encounter: Payer: PRIVATE HEALTH INSURANCE | Attending: Nurse Practitioner | Admitting: Nurse Practitioner

## 2016-11-19 DIAGNOSIS — K219 Gastro-esophageal reflux disease without esophagitis: Secondary | ICD-10-CM | POA: Diagnosis not present

## 2016-11-19 DIAGNOSIS — B070 Plantar wart: Secondary | ICD-10-CM | POA: Diagnosis not present

## 2016-11-19 DIAGNOSIS — Z880 Allergy status to penicillin: Secondary | ICD-10-CM | POA: Insufficient documentation

## 2016-11-19 DIAGNOSIS — Z8249 Family history of ischemic heart disease and other diseases of the circulatory system: Secondary | ICD-10-CM | POA: Insufficient documentation

## 2016-11-19 DIAGNOSIS — D649 Anemia, unspecified: Secondary | ICD-10-CM | POA: Insufficient documentation

## 2016-11-19 DIAGNOSIS — B20 Human immunodeficiency virus [HIV] disease: Secondary | ICD-10-CM | POA: Insufficient documentation

## 2016-11-19 DIAGNOSIS — L97521 Non-pressure chronic ulcer of other part of left foot limited to breakdown of skin: Secondary | ICD-10-CM | POA: Diagnosis present

## 2016-11-19 DIAGNOSIS — L03116 Cellulitis of left lower limb: Secondary | ICD-10-CM | POA: Diagnosis not present

## 2016-11-19 MED ORDER — AMOXICILLIN-POT CLAVULANATE 875-125 MG PO TABS: 1.0000 | ORAL_TABLET | Freq: Two times a day (BID) | ORAL | 0 refills | Status: DC

## 2016-11-20 MED ORDER — AMOXICILLIN-POT CLAVULANATE 875-125 MG PO TABS: 1.0000 | ORAL_TABLET | Freq: Two times a day (BID) | ORAL | 0 refills | Status: DC

## 2016-11-20 NOTE — Telephone Encounter (Signed)
Called in additional prescription for total of 10 days of treatment per Dr Johnnye Sima. Message sent to patient. Should have been 10 days  Can you fix for me?  Thanks    ----- Message -----  From: Landis Gandy, RN  Sent: 11/19/2016  5:03 PM  To: Campbell Riches, MD  Subject: RE: Visit Follow-Up Question             I sent this to the patient. I saw you filled augmentin today, but only 1 week's worth?
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
## 2016-11-20 NOTE — Progress Notes (Signed)
HARKIRAT, OROZCO ([id number]) Visit Report for 11/19/2016 Chief Complaint Document Details Patient Name: Brendan Price, Brendan Price. Date of Service: 11/19/2016 9:15 AM Medical Record Number: [id number] Patient Account Number: [id number] Date of Birth/Sex: Aug 09, 1971 (46 y.o. Male) Treating RN: Baruch Gouty, RN, BSN, Velva Harman Primary Care Provider: Lavone Orn Other Clinician: Referring Provider: Lavone Orn Treating Provider/Extender: Cathie Olden in Treatment: 2 Information Obtained from: Patient Chief Complaint patient is here for follow up evaluation of his left lateral foot ulcer Electronic Signature(s) Signed: 11/19/2016 9:44:01 AM By: Lawanda Cousins Entered By: Lawanda Cousins on 11/19/2016 09:44:01 Brendan Price ([id number]) -------------------------------------------------------------------------------- Debridement Details Patient Name: Brendan Price Date of Service: 11/19/2016 9:15 AM Medical Record Number: [id number] Patient Account Number: [id number] Date of Birth/Sex: [birth year] (46 y.o. Male) Treating RN: Baruch Gouty, RN, BSN, Velva Harman Primary Care Provider: Lavone Orn Other Clinician: Referring Provider: Lavone Orn Treating Provider/Extender: Cathie Olden in Treatment: 2 Debridement Performed for Wound #1 Left,Lateral Foot Assessment: Performed By: Physician Lawanda Cousins, NP Debridement: Open Wound/Selective Debridement Selective Description: Pre-procedure Yes - 09:33 Verification/Time Out Taken: Start Time: 09:33 Pain Control: Lidocaine 4% Topical Solution Level: Non-Viable Tissue Total Area Debrided (L x 1.5 (cm) x 4.2 (cm) = 6.3 (cm) W): Tissue and other Non-Viable, Fibrin/Slough material debrided: Instrument: Curette Bleeding: Minimum Hemostasis Achieved: Pressure End Time: 09:36 Procedural Pain: 2 Post Procedural Pain: 2 Response to Treatment: Procedure was tolerated well Post Debridement Measurements of Total Wound Length: (cm) 1.5 Width: (cm) 4.2 Depth:  (cm) 0.1 Volume: (cm) 0.495 Character of Wound/Ulcer Post Requires Further Debridement Debridement: Severity of Tissue Post Debridement: Fat layer exposed Post Procedure Diagnosis Same as Pre-procedure Electronic Signature(s) Signed: 11/19/2016 9:43:30 AM By: Lawanda Cousins Signed: 11/19/2016 5:03:43 PM By: Regan Lemming BSN, RN Elpers, Esmeralda Links ([id number]) Entered By: Lawanda Cousins on 11/19/2016 09:43:29 Brendan Price ([id number]) -------------------------------------------------------------------------------- HPI Details Patient Name: Brendan Price Date of Service: 11/19/2016 9:15 AM Medical Record Number: [id number] Patient Account Number: [id number] Date of Birth/Sex: [birth year] (46 y.o. 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 He states his problem began in December 2017 with a plantar wart on the lateral aspect of his left midfoot. He had a application of liquid nitrogen by Dr. Allyson Sabal his dermatologist. Then subsequently he used some form of topical agent which caused a large blister and denuded skin in this area. For a while he was using Neosporin on this area. Sometime in early March she started developing erythema and pain and he was seen in the emergency room on 3/10 what looks like extensive cellulitis on the lateral to mid part of his foot extending into his lower lateral ankle. At that point over the now lateral foot there was a necrotic surface noted by the pictures and Epic. He was given IV antibiotics in the ER and I think Dr. Johnnye Sima gave him doxycycline. Culture from 3/12 showed methicillin sensitive staph aureus and he is been on Augmentin for about 4 days now. The erythema here is considerably better.  Using Xeroform to the wound. The patient is not a diabetic and does not smoke. No relevant wound history that I'm aware of. 11/12/16; wound bed is improved. There is no erythema around the wound, I extended his Augmentin last week but I think that can stop now. He has been using  Santyl. 11/19/16- patient is here for follow-up evaluation of his left lateral foot ulcer. He has been using Santyl daily. He did return to work yesterday and was able to elevate his like thought the day. He did notice an increase in pain with ambulation, primarily with the distance to his car versus ambulating within the pharmacy. He is voicing no complaints or concerns. He has been 1 week off of his antibiotics. Electronic Signature(s) Signed: 11/19/2016 9:45:23 AM By: Lawanda Cousins Entered By: Lawanda Cousins on 11/19/2016 09:45:22 Brendan Price ([id number]) -------------------------------------------------------------------------------- Physical Exam Details Patient Name: Brendan Price Date of Service: 11/19/2016 9:15 AM Medical Record Number: [id number] Patient Account Number: [id number] Date of Birth/Sex: [birth year] (46 y.o. Male) Treating RN: Baruch Gouty, RN, BSN, Velva Harman Primary Care Provider: Lavone Orn Other Clinician: Referring Provider: Lavone Orn Treating Provider/Extender: Cathie Olden in Treatment: 2 Constitutional BP within normal limits. afebrile. well nourished; well developed; appears stated age;Marland Kitchen Respiratory non-labored respiratory effort. Cardiovascular LLE-palpable DP and PT. LLE- warm extremity; no edema present; no discoloration; cap refill less than or equal to 3 seconds. Musculoskeletal ambulated without assistance; steady gait; surgical shoe bilaterally. Integumentary (Hair, Skin) Left lateral foot ulcer- service debris/biofilm, granular buds present, minimal periwound erythema, no femoral changes, no malodor, no induration, no fluctuance. Psychiatric appears to make sound  judgement and have accurate insight regarding healthcare. oriented to time, place, person and situation. calm, pleasant, conversive. Electronic Signature(s) Signed: 11/19/2016 9:46:50 AM By: Lawanda Cousins Entered By: Lawanda Cousins on 11/19/2016 09:46:50 Brendan Price ([id number]) -------------------------------------------------------------------------------- Physician Orders Details Patient Name: Brendan Price Date of Service: 11/19/2016 9:15 AM Medical Record Number: [id number] Patient Account Number: [id number] Date of Birth/Sex: September 26, 1970 (46 y.o. Male) Treating RN: Baruch Gouty, RN, BSN, [place] Primary Care Provider: Lavone Orn Other Clinician: Referring Provider: Lavone Orn Treating Provider/Extender: Cathie Olden in Treatment: 2 Verbal / Phone Orders: No Diagnosis Coding Wound Cleansing Wound #1 Left,Lateral Foot o Cleanse wound with mild soap and water Anesthetic Wound #1 Left,Lateral Foot o Topical Lidocaine 4% cream applied to wound bed prior to debridement Primary Wound Dressing Wound #1 Left,Lateral Foot o Santyl Ointment Secondary Dressing Wound #1 Left,Lateral Foot o Gauze and Kerlix/Conform Dressing Change Frequency Wound #1 Left,Lateral Foot o Change dressing every day. Follow-up Appointments Wound #1 Left,Lateral Foot o Return Appointment in 1 week. Additional Orders / Instructions Wound #1 Left,Lateral Foot o Increase protein intake. o Activity as tolerated Electronic Signature(s) Signed: 11/19/2016 9:47:04 AM By: Lawanda Cousins Entered By: Lawanda Cousins on 11/19/2016 09:47:04 Brendan Price ([name] ([id number]) -------------------------------------------------------------------------------- Problem List Details Patient Name: Brendan Price Date of Service: 11/19/2016 9:15 AM Medical Record Number: [id number] Patient Account Number: [id number] Date of Birth/Sex: [birth year] (46 y.o. 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 He states his problem began in December 2017 with a plantar wart on the lateral aspect of his left midfoot. He had a application of liquid nitrogen by Dr. Allyson Sabal his dermatologist. Then subsequently he used some form of topical agent which caused a large blister and denuded skin in this area. For a while he was using Neosporin on this area. Sometime in early March she started developing erythema and pain and he was seen in the emergency room on 3/10 what looks like extensive cellulitis on the lateral to mid part of his foot extending into his lower lateral ankle. At that point over the  now lateral foot there was a necrotic surface noted by the pictures and Epic. He was given IV antibiotics in the ER and I think Dr. Johnnye Sima gave him doxycycline. Culture from 3/12 showed methicillin sensitive staph aureus and he is been on Augmentin for about 4 days now. The erythema here is considerably better. Using Xeroform to the wound. The patient is not a diabetic and does not smoke. No relevant wound history that I'm aware of. 11/12/16; wound bed is improved. There is no erythema around the wound, I extended his Augmentin last week but I think that can stop now. He has been using Santyl. 11/19/16- patient is here for follow-up evaluation of his left lateral foot ulcer. He has been using Santyl daily. He did return to work yesterday and was able to elevate his like thought the day. He did notice an increase in pain with ambulation, primarily with the distance to his car versus ambulating within the pharmacy. He is voicing no complaints or concerns. He has been 1 week off of his antibiotics. Objective Constitutional BP within normal limits. afebrile. well nourished; well developed; appears stated age;Marland Kitchen Vitals Time Taken: 9:16 AM, Height: 71 in, Weight: 174 lbs, BMI: 24.3, Temperature: 98.3 F, Pulse: 98 bpm, Respiratory Rate: 18 breaths/min, Blood Pressure: 130/89 mmHg. AUDI, [place] ([id number]) Respiratory non-labored respiratory effort. Cardiovascular LLE-palpable DP and PT. LLE- warm extremity; no edema present; no discoloration; cap refill less than or equal to 3 seconds. Musculoskeletal ambulated without assistance; steady gait; surgical shoe bilaterally. Psychiatric appears to make sound judgement and have accurate insight regarding healthcare. oriented to time, place, person and situation. calm, pleasant, conversive. Integumentary (Hair, Skin) Left lateral foot ulcer- service debris/biofilm, granular buds present, minimal periwound erythema, no femoral changes, no malodor,  no induration, no fluctuance. Wound #1 status is Open. Original cause of wound was Gradually Appeared. The wound is located on the Left,Lateral Foot. The wound measures 1.5cm length x 4.2cm width x 0.1cm depth; 4.948cm^2 area and 0.495cm^3 volume. There is Fat Layer (Subcutaneous Tissue) Exposed exposed. There is no tunneling or undermining noted. There is a medium amount of serosanguineous drainage noted. The wound margin is flat and intact. There is medium (34-66%) pink, pale granulation within the wound bed. There is a small (1-33%) amount of necrotic tissue within the wound bed including Adherent Slough. The periwound skin appearance exhibited: Erythema. The periwound skin appearance did not exhibit: Callus, Crepitus, Excoriation, Induration, Rash, Scarring, Dry/Scaly, Maceration, Atrophie Blanche, Cyanosis, Ecchymosis, Hemosiderin Staining, Mottled, Pallor, Rubor. The surrounding wound skin color is noted with erythema which is  circumferential. Periwound temperature was noted as No Abnormality. The periwound has tenderness on palpation. 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 ([id number]) - Biofilm/bruit removed easily, it appears the Santyl is effectively removing debris and slough and we will switch products next week Procedures Wound #1 Wound #1 is a Cyst located on the Left,Lateral Foot . There was a Non-Viable Tissue Open Wound/Selective [phone]) debridement with total area of 6.3 sq cm performed by Lawanda Cousins, NP. with the following instrument(s): Curette to remove Non-Viable tissue/material including Fibrin/Slough after achieving pain control using Lidocaine 4% Topical Solution. A time out was conducted at 09:33, prior to the start of the procedure. A Minimum amount of bleeding was controlled with Pressure.  The procedure was tolerated well with a pain level of 2 throughout and a pain level of 2 following the procedure. Post Debridement Measurements: 1.5cm length x 4.2cm width x 0.1cm depth; 0.495cm^3 volume. Character of Wound/Ulcer Post Debridement requires further debridement. Severity of Tissue Post Debridement is: Fat layer exposed. Post procedure Diagnosis Wound #1: Same as Pre-Procedure Plan Wound Cleansing: Wound #1 Left,Lateral Foot: Cleanse wound with mild soap and water Anesthetic: Wound #1 Left,Lateral Foot: Topical Lidocaine 4% cream applied to wound bed prior to debridement Primary Wound Dressing: Wound #1 Left,Lateral Foot: Santyl Ointment Secondary Dressing: Wound #1 Left,Lateral Foot: Gauze and Kerlix/Conform Dressing Change Frequency: Wound #1 Left,Lateral Foot: Change dressing every day. Follow-up Appointments: Wound #1 Left,Lateral Foot: Return Appointment in 1 week. Additional Orders / Instructions: Wound #1 Left,Lateral Foot: Increase protein intake. Activity as tolerated Blasius, Diarra G. ([id number]) 1. Continue with Santyl daily 2. Continue with offloading elevation throughout the work day and at home next line 3. Follow-up next week Electronic Signature(s) Signed: 11/19/2016 9:48:25 AM By: Lawanda Cousins Entered By: Lawanda Cousins on 11/19/2016 09:48:25 Brendan Price ([id number]) -------------------------------------------------------------------------------- SuperBill Details Patient Name: Brendan Price Date of Service: 11/19/2016 Medical Record Number: [id number] Patient Account Number: [id number] Date of Birth/Sex: 11/22/1970 (46 y.o. Male) Treating RN: Baruch Gouty, RN, BSN, [place] Primary Care Provider: Lavone Orn Other Clinician: Referring Provider: Lavone Orn Treating Provider/Extender: Cathie Olden in Treatment: 2 Diagnosis Coding ICD-10 Codes Code Description [phone] Non-pressure chronic ulcer of other part of left foot limited to  breakdown of skin L03.116 Cellulitis of left lower limb B07.0 Plantar wart L97.522 Non-pressure chronic ulcer of other part of left foot with fat layer exposed Facility Procedures CPT4 Code Description: [id number] - DEBRIDE WOUND 1ST 20 SQ CM OR < ICD-10 Description Diagnosis L97.522 Non-pressure chronic ulcer of other part of left foot Modifier: with fat lay Quantity: 1 er exposed Physician Procedures CPT4 Code Description: [id number] - WC PHYS DEBR WO ANESTH 20 SQ CM ICD-10 Description Diagnosis L97.522 Non-pressure chronic ulcer of other part of left foot Modifier: with fat layer Quantity: 1 exposed Electronic Signature(s) Signed: 11/19/2016 9:48:54 AM By: Lawanda Cousins Entered By: Lawanda Cousins on 11/19/2016 09:48:54

## 2016-11-26 ENCOUNTER — Encounter: Payer: PRIVATE HEALTH INSURANCE | Admitting: Internal Medicine

## 2016-11-26 DIAGNOSIS — L97521 Non-pressure chronic ulcer of other part of left foot limited to breakdown of skin: Secondary | ICD-10-CM | POA: Diagnosis not present
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
## 2016-11-28 NOTE — Progress Notes (Signed)
ENIO, HORNBACK ([id number]) Visit Report for 11/26/2016 Chief Complaint Document Details Patient Name: Brendan Price, Brendan Price. Date of Service: 11/26/2016 9:15 AM Medical Record Number: [id number] Patient Account Number: [id number] Date of Birth/Sex: 05-25-1971 (46 y.o. Male) Treating RN: Brendan Gouty, RN, BSN, Brendan Price Primary Care Provider: Lavone Price Other Clinician: Referring Provider: Lavone Price Treating Provider/Extender: Brendan Price in Treatment: 3 Information Obtained from: Patient Chief Complaint patient is here for follow up evaluation of his left lateral foot ulcer Electronic Signature(s) Signed: 11/26/2016 5:28:32 PM By: Brendan Ham MD Entered By: Brendan Price on 11/26/2016 10:18:15 Brendan Price ([id number]) -------------------------------------------------------------------------------- Debridement Details Patient Name: Brendan Price Date of Service: 11/26/2016 9:15 AM Medical Record Number: [id number] Patient Account Number: [id number] Date of Birth/Sex: 05-11-1971 (46 y.o. Male) Treating RN: Afful, RN, BSN, Brendan Price Primary Care Provider: Lavone Price Other Clinician: Referring Provider: Lavone Price Treating Provider/Extender: Brendan Price in Treatment: 3 Debridement Performed for Wound #1 Left,Lateral Foot Assessment: Performed By: Physician Brendan Dillon, MD Debridement: Debridement Pre-procedure Yes - 09:15 Verification/Time Out Taken: Start Time: 09:15 Pain Control: Lidocaine 4% Topical Solution Level: Skin/Subcutaneous Tissue Total Area Debrided (L x 1.2 (cm) x 6.5 (cm) = 7.8 (cm) W): Tissue and other Non-Viable, Exudate, Fat, Fibrin/Slough, Subcutaneous material debrided: Instrument: Curette Bleeding: Minimum Hemostasis Achieved: Pressure End Time: 09:18 Procedural Pain: 3 Post Procedural Pain: 3 Response to Treatment: Procedure was tolerated well Post Debridement Measurements of Total Wound Length: (cm) 2.2 Width: (cm)  5 Depth: (cm) 0.1 Volume: (cm) 0.864 Character of Wound/Ulcer Post Stable Debridement: Severity of Tissue Post Debridement: Fat layer exposed Post Procedure Diagnosis Same as Pre-procedure Electronic Signature(s) Signed: 11/26/2016 5:28:32 PM By: Brendan Ham MD Signed: 11/26/2016 5:31:29 PM By: Brendan Price BSN, RN Entered By: Brendan Price on 11/26/2016 10:17:58 Brendan Price ([name], Brendan Price ([id number]) -------------------------------------------------------------------------------- HPI Details Patient Name: Brendan Price Date of Service: 11/26/2016 9:15 AM Medical Record Number: [id number] Patient Account Number: [id number] Date of Birth/Sex: [birth year] (46 y.o. 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 He states his problem began in December 2017 with a plantar wart on the lateral aspect of his left midfoot. He had a application of liquid nitrogen by Dr. Allyson Price his dermatologist. Then subsequently he used some form of topical agent which caused a large blister and denuded skin in this area. For a while he was using Neosporin on this area. Sometime in early March she started developing erythema and pain and he was seen in the emergency room on 3/10 what looks like extensive cellulitis on the lateral to mid part of his foot extending into his lower lateral ankle. At that point over the now lateral foot there was a necrotic surface noted by the pictures and Epic. He was given IV antibiotics in the ER and I think Brendan Price gave him doxycycline. Culture from 3/12 showed methicillin sensitive staph aureus and he is been on Augmentin for about 4 days now. The erythema here is considerably better.  Using Xeroform to the wound. The patient is not a diabetic and does not smoke. No relevant wound history that I'm aware of. 11/12/16; wound bed is improved. There is no erythema around the wound, I extended his Augmentin last week but I think that can stop now. He  has been using Santyl. 11/19/16- patient is here for follow-up evaluation of his left lateral foot ulcer. He has been using Santyl daily. He did return to work yesterday and was able to elevate his like thought the day. He did notice an increase in pain with ambulation, primarily with the distance to his car versus ambulating within the pharmacy. He is voicing no complaints or concerns. He has been 1 week off of his antibiotics. 11/26/16; the patient noted erythema last week and called Brendan Price of infectious disease. He is apparently now back on Augmentin. The erythema is improved. 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 He has still been using Santyl changing and every day Electronic Signature(s) Signed: 11/26/2016 5:28:32 PM By: Brendan Ham MD Entered By: Brendan Price on 11/26/2016 10:19:16 Brendan Price ([id number]) -------------------------------------------------------------------------------- Physical Exam Details Patient Name: Brendan Price Date of Service: 11/26/2016 9:15 AM Medical Record Number: [id number] Patient Account Number: [id number] Date of Birth/Sex: 07/17/1971 (46 y.o. Male) Treating RN: Brendan Gouty, RN, BSN, Brendan Price Primary Care Provider: Lavone Price Other Clinician: Referring Provider: Lavone Price Treating Provider/Extender: Brendan Price in Treatment: 3 Constitutional Patient is hypertensive.Marland Kitchen Respirations regular, non-labored and within target range.. Temperature is normal and within the target range for the patient.. Patient's appearance is neat and clean. Appears in no acute distress. Well nourished and well developed.. Cardiovascular Pedal pulses  palpable and strong bilaterally.. Notes Wound exam; on the lateral aspect of his left foot. The wound appears to be narrower however it is still just as long. He has tenderness underneath the wound and some erythema around it compatible with cellulitis. He has been put back on Augmentin. Using a #3 curet I remove necrotic surface material. He tolerates this fairly well. When the wound is put under a fluorescent light the surface appears a yellow color i.e. not as viable as I might like. Therefore I'm continuing the [employer]) Signed: 11/26/2016 5:28:32 PM By: Brendan Ham MD Entered By: Brendan Price on 11/26/2016 10:21:14 Brendan Price ([id number]) -------------------------------------------------------------------------------- Physician Orders Details Patient Name: Brendan Price Date of Service: 11/26/2016 9:15 AM Medical Record Number: [id number] Patient Account Number: [id number] Date of Birth/Sex: Jul 25, 1971 (46 y.o. Male) Treating RN: Brendan Gouty, RN, BSN, [place] Primary Care Provider: Lavone Price Other Clinician: Referring Provider: Lavone Price Treating Provider/Extender: Brendan Price in Treatment: 3 Verbal / Phone Orders: No Diagnosis Coding Wound Cleansing Wound #1 Left,Lateral Foot o Cleanse wound with mild soap and water Anesthetic Wound #1 Left,Lateral Foot o Topical Lidocaine 4% cream applied to wound bed prior to debridement Primary Wound Dressing Wound #1 Left,Lateral Foot o Santyl Ointment Secondary Dressing Wound #1 Left,Lateral Foot o Gauze and Kerlix/Conform Dressing Change Frequency Wound #1 Left,Lateral Foot o Change dressing every day. Follow-up Appointments Wound #1 Left,Lateral Foot o Return Appointment in 1 week. Additional Orders / Instructions Wound #1 Left,Lateral Foot o Increase protein intake. o Activity as tolerated Electronic Signature(s) Signed: 11/26/2016 5:28:32 PM By: Brendan Ham  MD Signed: 11/26/2016 5:31:29 PM By: Brendan Price BSN, RN Entered By: Brendan Price on 11/26/2016 Metcalfe, [place]. ([name] ([id number]) -------------------------------------------------------------------------------- Problem List Details Patient Name: DELLA, HOMAN. Date of Service: 11/26/2016 9:15 AM Medical Record Number: [id number] Patient Account Number: [id number] Date of Birth/Sex: 1971-01-10 (46 y.o. 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 He had a application of liquid nitrogen by Dr. Allyson Price his dermatologist. Then subsequently he used some form of topical agent which caused a large blister and denuded skin in this area. For a while he was using Neosporin on this area. Sometime in early March she started developing erythema and pain and he was seen in the emergency room on 3/10 what looks like extensive cellulitis on the lateral to mid part of his foot extending into his lower lateral ankle. At that point over the now lateral foot there was a necrotic surface noted by the pictures and Epic. He was given IV antibiotics in the ER and I think Brendan Price gave him doxycycline. Culture from 3/12 showed methicillin sensitive staph aureus and he is been on Augmentin for about 4 days now. The erythema here is considerably better. Using Xeroform to the wound. The patient is not a diabetic and does not smoke. No relevant wound history that I'm aware of. 11/12/16; wound bed is improved. There is no erythema around the wound, I extended his Augmentin last week but I think that can stop now. He has been using Santyl. 11/19/16- patient is here for follow-up evaluation of his left lateral foot ulcer. He has been using Santyl daily. He did return to work yesterday and was able to elevate his like thought the day. He did notice an increase in pain with ambulation, primarily with the distance to his car versus ambulating within the pharmacy. He is voicing no complaints or concerns. He has been 1 week off of his antibiotics. 11/26/16; the patient noted erythema last week and called Brendan Price of infectious disease. He is apparently now back on Augmentin. The erythema is improved. 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 He has still been using Santyl changing and every day Objective Constitutional Patient is hypertensive.Marland Kitchen Respirations regular, non-labored and within target range.. Temperature is  normal and within the target range for the patient.. Patient's appearance is neat and clean. Appears in no acute distress. Well nourished and well developed.Marland Kitchen Brendan Price, Brendan Price ([id number]) Vitals Time Taken: 9:01 AM, Height: 71 in, Weight: 174 lbs, BMI: 24.3, Temperature: 98.5 F, Pulse: 84 bpm, Respiratory Rate: 16 breaths/min, Blood Pressure: 121/93 mmHg. Cardiovascular Pedal pulses palpable and strong bilaterally.. General Notes: Wound exam; on the lateral aspect of his left foot. The wound appears to be narrower however it  is still just as long. He has tenderness underneath the wound and some erythema around it compatible with cellulitis. He has been put back on Augmentin. Using a #3 curet I remove necrotic surface material. He tolerates this fairly well. When the wound is put under a fluorescent light the surface appears a yellow color i.e. not as viable as I might like. Therefore I'm continuing the Santyl Integumentary (Hair, Skin) Wound #1 status is Open. Original cause of wound was Gradually Appeared. The wound is located on the Left,Lateral Foot. The wound measures 1.2cm length x 6.5cm width x 0.1cm depth; 6.126cm^2 area and 0.613cm^3 volume. There is Fat Layer (Subcutaneous Tissue) Exposed exposed. There is no tunneling or undermining noted. There is a medium amount of serosanguineous drainage noted. The wound margin is flat and intact. There is medium (34-66%) pink, pale granulation within the wound bed. There is a medium (34-66%) amount of necrotic tissue within the wound bed including Adherent Slough. The periwound skin appearance exhibited: Erythema. The periwound skin appearance did not exhibit: Callus, Crepitus, Excoriation, Induration, Rash, Scarring, Dry/Scaly, Maceration, Atrophie Blanche, Cyanosis, Ecchymosis, Hemosiderin Staining, Mottled, Pallor, Rubor. The surrounding wound skin color is noted with erythema which is circumferential. Periwound temperature was noted as  Hot. The periwound has tenderness on palpation. 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 There was a Skin/Subcutaneous Tissue Debridement ([id number]) debridement with total area of 7.8 sq cm performed by Brendan Dillon, MD. with the Brendan Price ([id number]) following instrument(s): Curette to remove Non-Viable tissue/material including Exudate, Fat Layer (and Subcutaneous Tissue) Exposed, Fibrin/Slough, and Subcutaneous after achieving pain control using Lidocaine 4% Topical Solution. A time out was conducted at 09:15, prior to the start of the procedure. A Minimum amount of bleeding was controlled with Pressure. The procedure was tolerated well with a pain level of 3 throughout and a pain level of 3 following the procedure. Post Debridement Measurements: 2.2cm length x 5cm width x 0.1cm depth; 0.864cm^3 volume. Character of Wound/Ulcer Post Debridement is stable. Severity of Tissue Post Debridement is: Fat layer exposed. Post procedure Diagnosis Wound #1: Same as Pre-Procedure Plan Wound Cleansing: Wound #1 Left,Lateral Foot: Cleanse wound with mild soap and water Anesthetic: Wound #1 Left,Lateral Foot: Topical Lidocaine 4% cream applied to wound bed prior to debridement Primary Wound Dressing: Wound #1 Left,Lateral Foot: Santyl Ointment Secondary Dressing: Wound #1 Left,Lateral Foot: Gauze and Kerlix/Conform Dressing Change Frequency: Wound #1 Left,Lateral Foot: Change dressing every day. Follow-up Appointments: Wound #1 Left,Lateral Foot: Return Appointment in 1 week. Additional Orders / Instructions: Wound #1 Left,Lateral Foot: Increase protein intake. Activity as tolerated #1 continue the Santyl ointment although I am looking towards  changing to a collagen perhaps next week #2 continue Augmentin as prescribed by Dr. Lynnell Chad, Brendan Price ([id number]) #3 I didn't think any additional imaging studies needed to be done although I'll continue to keep this thought in mind. Electronic Signature(s) Signed: 11/26/2016 5:28:32 PM By: Brendan Ham MD Entered By: Brendan Price on 11/26/2016 10:22:05 Brendan Price ([id number]) -------------------------------------------------------------------------------- [place] Details Patient Name: Brendan Price Date of Service: 11/26/2016 Medical Record Number: [id number] Patient Account Number: [id number] Date of Birth/Sex: Jun 17, 1971 (46 y.o. Male) Treating RN: Brendan Gouty, RN, BSN, Brendan Price Primary Care Provider: Lavone Price Other Clinician: Referring Provider: Lavone Price  Treating Provider/Extender: Brendan Price Weeks in Treatment: 3 Diagnosis Coding ICD-10 Codes Code Description L97.521 Non-pressure chronic ulcer of other part of left foot limited to breakdown of skin L03.116 Cellulitis of left lower limb B07.0 Plantar wart Facility Procedures CPT4: Description Modifier Quantity Code [id number] 11042 - DEB SUBQ TISSUE 20 SQ CM/< 1 ICD-10 Description Diagnosis L97.521 Non-pressure chronic ulcer of other part of left foot limited to breakdown of skin Physician Procedures CPT4: Description Modifier Quantity Code [id number] 98022 - WC PHYS SUBQ TISS 20 SQ CM 1 ICD-10 Description Diagnosis L97.521 Non-pressure chronic ulcer of other part of left foot limited to breakdown of skin Electronic Signature(s) Signed: 11/26/2016 5:28:32 PM By: Brendan Ham MD Entered By: Brendan Price on 11/26/2016 10:22:29

## 2016-12-03 ENCOUNTER — Encounter: Payer: PRIVATE HEALTH INSURANCE | Admitting: Internal Medicine

## 2016-12-03 DIAGNOSIS — L97521 Non-pressure chronic ulcer of other part of left foot limited to breakdown of skin: Secondary | ICD-10-CM | POA: Diagnosis not present
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
## 2016-12-05 NOTE — Progress Notes (Signed)
JAREN, VANETTEN ([id number]) Visit Report for 12/03/2016 Chief Complaint Document Details Patient Name: Brendan Price, Brendan Price. Date of Service: 12/03/2016 9:15 AM Medical Record Number: [id number] Patient Account Number: [id number] Date of Birth/Sex: 02/10/1971 (46 y.o. Male) Treating RN: Baruch Gouty, RN, BSN, Velva Harman Primary Care Provider: Lavone Orn Other Clinician: Referring Provider: Lavone Orn Treating Provider/Extender: Tito Dine in Treatment: 4 Information Obtained from: Patient Chief Complaint patient is here for follow up evaluation of his left lateral foot ulcer Electronic Signature(s) Signed: 12/04/2016 7:57:33 AM By: Linton Ham MD Entered By: Linton Ham on 12/03/2016 09:53:10 Brendan Price ([id number]) -------------------------------------------------------------------------------- HPI Details Patient Name: Brendan Price Date of Service: 12/03/2016 9:15 AM Medical Record Number: [id number] Patient Account Number: [id number] Date of Birth/Sex: Jun 30, 1971 (46 y.o. 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 He states his problem began in December 2017 with a plantar wart on the lateral aspect of his left midfoot. He had a application of liquid nitrogen by Dr. Allyson Sabal his dermatologist. Then subsequently he used some form of topical agent which caused a large blister and denuded skin in this area. For a while he was using Neosporin on this area. Sometime in early March she started developing erythema and pain and he was seen in the emergency room on 3/10 what looks like extensive cellulitis on the lateral to mid part of his foot extending into his lower lateral ankle. At that point over the now  lateral foot there was a necrotic surface noted by the pictures and Epic. He was given IV antibiotics in the ER and I think Dr. Johnnye Sima gave him doxycycline. Culture from 3/12 showed methicillin sensitive staph aureus and he is been on Augmentin for about 4 days now. The erythema here is considerably better. Using Xeroform to the wound. The patient is not a diabetic and does not smoke. No relevant wound history that I'm aware of. 11/12/16; wound bed is improved. There is no erythema around the wound, I extended his Augmentin last week but I think that can stop now. He has been using Santyl. 11/19/16- patient is here for follow-up evaluation of his left lateral foot ulcer. He has been using Santyl daily. He did return to work yesterday and was able to elevate his like thought the day. He did notice an increase in pain with ambulation, primarily with the distance to his car versus ambulating within the pharmacy. He is voicing no complaints or concerns. He has been 1 week off of his antibiotics. 11/26/16; the patient noted erythema last week and called Dr. Johnnye Sima of infectious disease. He is apparently now back on Augmentin. The erythema is improved. 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 He has still been using Santyl changing and every day 12/03/16; the patient completed his Augmentin on Tuesday. We have been using Santyl changing every day. Electronic Signature(s) Signed: 12/04/2016 7:57:33 AM By: Linton Ham MD Entered By: Linton Ham on 12/03/2016 09:53:43 Brendan Price ([id number]) -------------------------------------------------------------------------------- Physical Exam Details Patient Name: Brendan Price Date of Service: 12/03/2016 9:15 AM Medical Record Number: [id number] Patient Account Number: [id number] Date of Birth/Sex: Dec 26, 1970 (46 y.o. Male) Treating RN: Baruch Gouty, RN, BSN, Velva Harman Primary Care Provider: Lavone Orn Other  Clinician: Referring Provider:  GRIFFIN, JOHN Treating Provider/Extender: Ricard Dillon Weeks in Treatment: 4 Constitutional Patient is hypertensive.. Pulse regular and within target range for patient.Marland Kitchen Respirations regular, non-labored and within target range.. Temperature is normal and within the target range for the patient.. Patient's appearance is neat and clean. Appears in no acute distress. Well nourished and well developed.Marland Kitchen Respiratory Respiratory effort is easy and symmetric bilaterally. Rate is normal at rest and on room air.. Cardiovascular Pedal pulses palpable and strong bilaterally.. Lymphatic None palpable in the popliteal area. Integumentary (Hair, Skin) No surrounding erythema in the foot. Notes Wound exam; on the lateral aspect of his left foot. The wound appears to be making good progress and is epithelialized distally. There is also epithelization in the middle of the wound. The patient is concerned about infection although there is no erythema and no tenderness to palpation. No soft tissue crepitus. Electronic Signature(s) Signed: 12/04/2016 7:57:33 AM By: Linton Ham MD Entered By: Linton Ham on 12/03/2016 09:54:48 Brendan Price ([id number]) -------------------------------------------------------------------------------- Physician Orders Details Patient Name: Brendan Price Date of Service: 12/03/2016 9:15 AM Medical Record Number: [id number] Patient Account Number: [id number] Date of Birth/Sex: 1971/01/23 (46 y.o. Male) Treating RN: Afful, RN, BSN, [place] Primary Care Provider: Lavone Orn Other Clinician: Referring Provider: Lavone Orn Treating Provider/Extender: Tito Dine in Treatment: 4 Verbal / Phone Orders: No Diagnosis Coding Wound Cleansing Wound #1 Left,Lateral Foot o Cleanse wound with mild soap and water Anesthetic Wound #1 Left,Lateral Foot o Topical Lidocaine 4% cream applied to wound bed prior to  debridement Primary Wound Dressing Wound #1 Left,Lateral Foot o Hydrogel o Promogran Secondary Dressing Wound #1 Left,Lateral Foot o Gauze and Kerlix/Conform Dressing Change Frequency Wound #1 Left,Lateral Foot o Change dressing every day. Follow-up Appointments Wound #1 Left,Lateral Foot o Return Appointment in 1 week. Additional Orders / Instructions Wound #1 Left,Lateral Foot o Increase protein intake. o Activity as tolerated Electronic Signature(s) Signed: 12/03/2016 4:51:03 PM By: Brendan Price Lemming BSN, RN Signed: 12/04/2016 7:57:33 AM By: Linton Ham MD Brendan Price ([id number]) Entered By: Brendan Price Lemming on 12/03/2016 09:35:16 Brendan Price ([id number]) -------------------------------------------------------------------------------- Problem List Details Patient Name: Brendan Price Date of Service: 12/03/2016 9:15 AM Medical Record Number: [id number] Patient Account Number: [id number] Date of Birth/Sex: 01-25-1971 (46 y.o. 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 He had a application of liquid  nitrogen by Dr. Allyson Sabal his dermatologist. Then subsequently he used some form of topical agent which caused a large blister and denuded skin in this area. For a while he was using Neosporin on this area. Sometime in early March she started developing erythema and pain and he was seen in the emergency room on 3/10 what looks like extensive cellulitis on the lateral to mid part of his foot extending into his lower lateral ankle. At that point over the now lateral foot there was a necrotic surface noted by the pictures and Epic. He was given IV antibiotics in the ER and I think Dr. Johnnye Sima gave him doxycycline. Culture from 3/12 showed methicillin sensitive staph aureus and he is been on Augmentin for about 4 days now. The erythema here is considerably better. Using Xeroform to the wound. The patient is not a diabetic and does not smoke. No relevant wound history that I'm aware of. 11/12/16; wound bed is improved. There is no erythema around the wound, I extended his Augmentin last week but I think that can stop now. He has been using Santyl. 11/19/16- patient is here for follow-up evaluation of his left lateral foot ulcer. He has been using Santyl daily. He did return to work yesterday and was able to elevate his like thought the day. He did notice an increase in pain with ambulation, primarily with the distance to his car versus ambulating within  the pharmacy. He is voicing no complaints or concerns. He has been 1 week off of his antibiotics. 11/26/16; the patient noted erythema last week and called Dr. Johnnye Sima of infectious disease. He is apparently now back on Augmentin. The erythema is improved. 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 He has still been using Santyl changing and every day 12/03/16; the patient completed his Augmentin on Tuesday. We have been using Santyl changing every day. Objective Constitutional Patient is hypertensive.. Pulse regular and within target range for patient.Marland Kitchen Respirations regular, non-labored and within target range.. Temperature is normal and within the target range for the patient.. Patient's FLEET, HIGHAM ([id number]) appearance is neat and clean. Appears in no acute distress. Well nourished and well developed.. Vitals Time Taken: 9:23 AM, Height: 71 in, Weight: 174 lbs, BMI: 24.3, Temperature: 98.4 F, Pulse: 75 bpm, Respiratory Rate: 16 breaths/min, Blood Pressure: 154/87 mmHg. Respiratory Respiratory effort is easy and symmetric bilaterally. Rate is normal at rest and on room air.. Cardiovascular Pedal pulses palpable and strong bilaterally.. Lymphatic None palpable in the popliteal area. General Notes: Wound exam; on the lateral aspect of his left foot. The wound appears to be making good progress and is epithelialized distally. There is also epithelization in the middle of the wound. The patient is concerned about infection although there is no erythema and no tenderness to palpation. No soft tissue crepitus. Integumentary (Hair, Skin) No surrounding erythema in the foot. Wound #1 status is Open. Original cause of wound was Gradually Appeared. The wound is located on the Left,Lateral Foot. The wound measures 1.2cm length x 3cm width x 0.1cm depth; 2.827cm^2 area and 0.283cm^3 volume. There is Fat Layer (Subcutaneous Tissue) Exposed exposed. There  is no tunneling or undermining noted. There is a medium amount of serosanguineous drainage noted. The wound margin is flat and intact. There is medium (34-66%) pink, pale granulation within the wound bed. There is a medium (34-66%) amount of necrotic tissue within the wound bed including Adherent Slough. The periwound  skin appearance exhibited: Erythema. The periwound skin appearance did not exhibit: Callus, Crepitus, Excoriation, Induration, Rash, Scarring, Dry/Scaly, Maceration, Atrophie Blanche, Cyanosis, Ecchymosis, Hemosiderin Staining, Mottled, Pallor, Rubor. The surrounding wound skin color is noted with erythema which is circumferential. Periwound temperature was noted as Hot. The periwound has tenderness on palpation. 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 ([id number]) Plan Wound Cleansing: Wound #1 Left,Lateral Foot: Cleanse wound with mild soap and water Anesthetic: Wound #1 Left,Lateral Foot: Topical Lidocaine 4% cream applied to wound bed prior to debridement Primary Wound Dressing: Wound #1 Left,Lateral Foot: Hydrogel Promogran Secondary Dressing: Wound #1 Left,Lateral Foot: Gauze and Kerlix/Conform Dressing Change Frequency: Wound #1 Left,Lateral Foot: Change dressing every day. Follow-up Appointments: Wound #1 Left,Lateral Foot: Return Appointment in 1 week. Additional Orders / Instructions: Wound #1 Left,Lateral Foot: Increase protein intake. Activity as tolerated #1 headache a patient is making nice progress #2 we changed from Santyl to [facility] as the primary dressing which can be changed daily #3 I advised to continue aggressive offloading until we are fully epithelialized in this area, the patient expressed understanding #4 the patient had severe and recurrent infection in this area and is  recently completed Augmentin last week as directed by Dr. Johnnye Sima of infectious disease however I see no evidence of infection currently and I didn't think he required antibiotics or for that matter further testing or imaging Electronic Signature(s) Signed: 12/04/2016 7:57:33 AM By: Linton Ham MD Brendan Price ([id number]) Entered By: Linton Ham on 12/03/2016 09:55:59 Brendan Price ([id number]) -------------------------------------------------------------------------------- [place] Details Patient Name: Brendan Price Date of Service: 12/03/2016 Medical Record Number: [id number] Patient Account Number: [id number] Date of Birth/Sex: May 08, 1971 (46 y.o. Male) Treating RN: Baruch Gouty, RN, BSN, Velva Harman Primary Care Provider: Lavone Orn Other Clinician: Referring Provider: Lavone Orn Treating Provider/Extender: Tito Dine in Treatment: 4 Diagnosis Coding ICD-10 Codes Code Description [phone] Non-pressure chronic ulcer of other part of left foot limited to breakdown of skin L03.116 Cellulitis of left lower limb B07.0 Plantar wart Facility Procedures CPT4 Code: [id number] Description: 62863 - WOUND CARE VISIT-LEV 2 EST PT Modifier: Quantity: 1 Physician Procedures CPT4: Description Modifier Quantity Code [id number] - WC PHYS LEVEL 3 - EST PT 1 ICD-10 Description Diagnosis L97.521 Non-pressure chronic ulcer of other part of left foot limited to breakdown of skin L03.116 Cellulitis of left lower limb Electronic Signature(s) Signed: 12/04/2016 7:57:33 AM By: Linton Ham MD Entered By: Linton Ham on 12/03/2016 09:56:19

## 2016-12-10 ENCOUNTER — Encounter: Payer: PRIVATE HEALTH INSURANCE | Admitting: Internal Medicine

## 2016-12-10 DIAGNOSIS — L97521 Non-pressure chronic ulcer of other part of left foot limited to breakdown of skin: Secondary | ICD-10-CM | POA: Diagnosis not present
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
## 2016-12-11 NOTE — Progress Notes (Signed)
Brendan Price, Brendan Price ([id number]) Visit Report for 12/10/2016 Chief Complaint Document Details Patient Name: Brendan Price, Brendan Price. Date of Service: 12/10/2016 9:15 AM Medical Record Number: [id number] Patient Account Number: [id number] Date of Birth/Sex: 1970/09/09 (46 y.o. Male) Treating RN: Baruch Gouty, RN, BSN, Velva Harman Primary Care Provider: Lavone Orn Other Clinician: Referring Provider: Lavone Orn Treating Provider/Extender: Tito Dine in Treatment: 5 Information Obtained from: Patient Chief Complaint patient is here for follow up evaluation of his left lateral foot ulcer Electronic Signature(s) Signed: 12/11/2016 7:39:10 AM By: Linton Ham MD Entered By: Linton Ham on 12/10/2016 11:55:34 Brendan Price ([id number]) -------------------------------------------------------------------------------- HPI Details Patient Name: Brendan Price Date of Service: 12/10/2016 9:15 AM Medical Record Number: [id number] Patient Account Number: [id number] Date of Birth/Sex: Sep 22, 1970 (46 y.o. 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 He states his problem began in December 2017 with a plantar wart on the lateral aspect of his left midfoot. He had a application of liquid nitrogen by Dr. Allyson Sabal his dermatologist. Then subsequently he used some form of topical agent which caused a large blister and denuded skin in this area. For a while he was using Neosporin on this area. Sometime in early March she started developing erythema and pain and he was seen in the emergency room on 3/10 what looks like extensive cellulitis on the lateral to mid part of his foot extending into his lower lateral ankle. At that point over the now  lateral foot there was a necrotic surface noted by the pictures and Epic. He was given IV antibiotics in the ER and I think Dr. Johnnye Sima gave him doxycycline. Culture from 3/12 showed methicillin sensitive staph aureus and he is been on Augmentin for about 4 days now. The erythema here is considerably better. Using Xeroform to the wound. The patient is not a diabetic and does not smoke. No relevant wound history that I'm aware of. 11/12/16; wound bed is improved. There is no erythema around the wound, I extended his Augmentin last week but I think that can stop now. He has been using Santyl. 11/19/16- patient is here for follow-up evaluation of his left lateral foot ulcer. He has been using Santyl daily. He did return to work yesterday and was able to elevate his like thought the day. He did notice an increase in pain with ambulation, primarily with the distance to his car versus ambulating within the pharmacy. He is voicing no complaints or concerns. He has been 1 week off of his antibiotics. 11/26/16; the patient noted erythema last week and called Dr. Johnnye Sima of infectious disease. He is apparently now back on Augmentin. The erythema is improved. 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 He has still been using Santyl changing and every day 12/03/16; the patient completed his Augmentin on Tuesday. We have been using Santyl changing every day. 12/10/16; the patient has no evidence of ongoing infection in the lateral left foot. The wound is contracting and appears healthy. Using Prisma. 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 Date of Service: 12/10/2016 9:15 AM Medical Record Number: [id number] Patient Account Number: [id number] Date of Birth/Sex: 28-Nov-1970 (46 y.o. Male) Treating RN: Baruch Gouty, RN, BSN, Velva Harman Primary Care Provider: Lavone Orn Other Clinician: Referring Provider: Lavone Orn Treating Provider/Extender: Ricard Dillon Weeks in Treatment: 5 Constitutional Sitting or standing Blood Pressure is within target range for patient.. Pulse regular and within target range for patient.Marland Kitchen Respirations regular, non-labored and within target range.. Temperature is normal and within the target range for the patient.. Patient's appearance is neat and clean. Appears in no acute distress. Well nourished and well developed.. Eyes Conjunctivae clear. No discharge.Marland Kitchen Respiratory Respiratory effort is easy and symmetric bilaterally. Rate is normal at rest and on room air.. Cardiovascular Pedal pulses palpable and strong bilaterally.. Lymphatic None palpable in the popliteal or inguinal area. Psychiatric No evidence of depression, anxiety, or agitation. Calm, cooperative, and communicative. Appropriate interactions and affect.. Notes Wound exam; lateral aspect of his left foot. The wound appears to be making excellent progress and is epithelializing distally. No debridement is required. No ongoing infection is apparent now off antibiotics for 2 weeks. Electronic Signature(s) Signed: 12/11/2016 7:39:10 AM By: Linton Ham MD Entered By: Linton Ham on 12/10/2016 11:57:57 Brendan Price ([id number]) -------------------------------------------------------------------------------- Physician Orders Details Patient Name: Brendan Price Date of Service: 12/10/2016 9:15 AM Medical Record Number: [id number] Patient Account Number: [id number] Date of Birth/Sex:  1971-08-07 (46 y.o. Male) Treating RN: Afful, RN, BSN, [place] Primary Care Provider: Lavone Orn Other Clinician: Referring Provider: Lavone Orn Treating Provider/Extender: Tito Dine in Treatment: 5 Verbal / Phone Orders: No Diagnosis Coding Wound Cleansing Wound #1 Left,Lateral Foot o Cleanse wound with mild soap and water Anesthetic Wound #1 Left,Lateral Foot o Topical Lidocaine 4% cream applied to wound bed prior to debridement Primary Wound Dressing Wound #1 Left,Lateral Foot o Hydrogel o Promogran Secondary Dressing Wound #1 Left,Lateral Foot o Gauze and Kerlix/Conform Dressing Change Frequency Wound #1 Left,Lateral Foot o Change dressing every day. Follow-up Appointments Wound #1 Left,Lateral Foot o Return Appointment in 1 week. Additional Orders / Instructions Wound #1 Left,Lateral Foot o Increase protein intake. o Activity as tolerated Electronic Signature(s) Signed: 12/10/2016 3:50:16 PM By: Regan Lemming BSN, RN Signed: 12/11/2016 7:39:10 AM By: Linton Ham MD Brendan Price ([id number]) Entered By: Regan Lemming on 12/10/2016 09:46:22 Brendan Price ([id number]) -------------------------------------------------------------------------------- Problem List Details Patient Name: Brendan Price Date of Service: 12/10/2016 9:15 AM Medical Record Number: [id number] Patient Account Number: [id number] Date of Birth/Sex: [birth year] (46 y.o. 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 He states his problem began in December 2017 with a plantar wart on the lateral aspect of his left midfoot. He had a application of liquid nitrogen by Dr. Allyson Sabal his dermatologist. Then subsequently he used some form of topical agent which caused a large blister and denuded skin in this area. For a while he was using Neosporin on this area. Sometime in early March she started developing erythema and pain and he was seen in the emergency room on 3/10 what looks like extensive cellulitis on the lateral to mid part of his foot extending into his lower lateral ankle. At that point over the now lateral foot there was a necrotic surface noted by the pictures and Epic. He was given IV antibiotics in the ER and I think Dr. Johnnye Sima gave him doxycycline. Culture from 3/12 showed methicillin sensitive staph aureus and he is been on Augmentin for about 4 days now. The erythema here is considerably better. Using Xeroform to the wound. The patient is not a diabetic and does not smoke. No relevant wound history that I'm aware of. 11/12/16;  wound bed is improved. There is no erythema around the wound, I extended his Augmentin last week but I think that can stop now. He has been using Santyl. 11/19/16- patient is here for follow-up evaluation of his left lateral foot ulcer. He has been using Santyl daily. He did return to work yesterday and was able to elevate his like thought the day. He did notice an increase in pain with ambulation, primarily with the distance to his car versus ambulating within the pharmacy. He is voicing no complaints or concerns. He has been 1 week off of his antibiotics. 11/26/16; the patient noted erythema last week and called Dr. Johnnye Sima of infectious disease. He is apparently now back on Augmentin. The erythema is improved. 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 He has still been using Santyl changing and every day 12/03/16; the patient completed his Augmentin on Tuesday. We have been using Santyl changing every day. 12/10/16; the patient has no evidence of ongoing infection in the lateral left foot. The wound is contracting and appears healthy. Using Prisma. 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 ([id number]) Constitutional Sitting or standing Blood Pressure is within target range for patient.. Pulse regular and within target range for patient.Marland Kitchen Respirations regular, non-labored and within target range.. Temperature is normal and within the target range for the patient.. Patient's appearance is neat and clean. Appears in no acute distress. Well nourished and well developed.. Vitals Time Taken: 9:11 AM, Height: 71 in, Weight: 174 lbs, BMI: 24.3, Temperature: 98.1 F, Pulse: 106 bpm, Respiratory Rate: 16 breaths/min, Blood Pressure: 142/92 mmHg. Eyes Conjunctivae clear. No discharge.Marland Kitchen Respiratory Respiratory  effort is easy and symmetric bilaterally. Rate is normal at rest and on room air.. Cardiovascular Pedal pulses palpable and strong bilaterally.. Lymphatic None palpable in the popliteal or inguinal area. Psychiatric No evidence of depression, anxiety, or agitation. Calm, cooperative, and communicative. Appropriate interactions and affect.. General Notes: Wound exam; lateral aspect of his left foot. The wound appears to be making excellent progress and  is epithelializing distally. No debridement is required. No ongoing infection is apparent now off antibiotics for 2 weeks. Integumentary (Hair, Skin) Wound #1 status is Open. Original cause of wound was Gradually Appeared. The wound is located on the Left,Lateral Foot. The wound measures 1cm length x 1.5cm width x 0.1cm depth; 1.178cm^2 area and 0.118cm^3 volume. There is Fat Layer (Subcutaneous Tissue) Exposed exposed. There is no tunneling or undermining noted. There is a medium amount of serosanguineous drainage noted. The wound margin is flat and intact. There is medium (34-66%) pink, pale granulation within the wound bed. There is a medium (34-66%) amount of necrotic tissue within the wound bed including Adherent Slough. The periwound skin appearance did not exhibit: Callus, Crepitus, Excoriation, Induration, Rash, Scarring, Dry/Scaly, Maceration, Atrophie Blanche, Cyanosis, Ecchymosis, Hemosiderin Staining, Mottled, Pallor, Rubor, Erythema. Periwound temperature was noted as Hot. The periwound has tenderness on palpation. 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 Follow-up Appointments: Wound #1 Left,Lateral Foot: Return Appointment in 1 week. Additional Orders / Instructions: Wound #1 Left,Lateral Foot: Increase protein intake. Activity as tolerated #1 we continued with Promogran and hydrogel and border foam he is changing this every other day #2 I suspect this will be healed in the next 2-3 weeks Electronic Signature(s) Signed: 12/11/2016 7:39:10 AM By: Linton Ham MD Brendan Price ([id number]) Entered By: Linton Ham on 12/10/2016 11:58:42 Brendan Price ([id number]) -------------------------------------------------------------------------------- [place]ell Details Patient Name: Brendan Price Date of Service: 12/10/2016 Medical Record Number: [id number] Patient Account Number: [id number] Date of Birth/Sex: [birth year] (46 y.o. Male) Treating RN: Baruch Gouty, RN, BSN, Velva Harman Primary Care Provider: Lavone Orn Other Clinician: Referring Provider: Lavone Orn Treating Provider/Extender: Tito Dine in Treatment: 5 Diagnosis Coding ICD-10 Codes Code Description [phone] Non-pressure chronic ulcer of other part of left foot limited to breakdown of skin L03.116 Cellulitis of left lower limb B07.0 Plantar wart Facility Procedures CPT4 Code: [id number] Description: [id number] - WOUND CARE VISIT-LEV 2 EST PT Modifier: Quantity: 1 Physician Procedures CPT4: Description Modifier Quantity Code [id number] - WC PHYS LEVEL 3 - EST PT 1 ICD-10 Description Diagnosis L97.521 Non-pressure chronic ulcer of other part of left foot limited to breakdown of skin Electronic Signature(s) Signed: 12/11/2016 7:39:10 AM By: Linton Ham MD Entered By: Linton Ham on 12/10/2016 11:58:56

## 2016-12-16 ENCOUNTER — Encounter: Payer: Self-pay | Admitting: Infectious Diseases

## 2016-12-17 ENCOUNTER — Encounter: Payer: PRIVATE HEALTH INSURANCE | Attending: Internal Medicine | Admitting: Internal Medicine

## 2016-12-17 DIAGNOSIS — Z8249 Family history of ischemic heart disease and other diseases of the circulatory system: Secondary | ICD-10-CM | POA: Insufficient documentation

## 2016-12-17 DIAGNOSIS — Z880 Allergy status to penicillin: Secondary | ICD-10-CM | POA: Insufficient documentation

## 2016-12-17 DIAGNOSIS — B20 Human immunodeficiency virus [HIV] disease: Secondary | ICD-10-CM | POA: Insufficient documentation

## 2016-12-17 DIAGNOSIS — L97521 Non-pressure chronic ulcer of other part of left foot limited to breakdown of skin: Secondary | ICD-10-CM | POA: Diagnosis not present

## 2016-12-17 DIAGNOSIS — D649 Anemia, unspecified: Secondary | ICD-10-CM | POA: Insufficient documentation

## 2016-12-17 DIAGNOSIS — K219 Gastro-esophageal reflux disease without esophagitis: Secondary | ICD-10-CM | POA: Diagnosis not present

## 2016-12-17 DIAGNOSIS — B070 Plantar wart: Secondary | ICD-10-CM | POA: Insufficient documentation

## 2016-12-17 DIAGNOSIS — L03116 Cellulitis of left lower limb: Secondary | ICD-10-CM | POA: Diagnosis not present

## 2016-12-19 NOTE — Progress Notes (Signed)
Brendan, Price ([id number]) Visit Report for 12/17/2016 Chief Complaint Document Details Patient Name: Brendan Price, Brendan Price. Date of Service: 12/17/2016 9:15 AM Medical Record Number: [id number] Patient Account Number: [id number] Date of Birth/Sex: [birth year] (46 y.o. Male) Treating RN: Baruch Gouty, RN, BSN, Velva Harman Primary Care Provider: Lavone Orn Other Clinician: Referring Provider: Lavone Orn Treating Provider/Extender: Tito Dine in Treatment: 6 Information Obtained from: Patient Chief Complaint patient is here for follow up evaluation of his left lateral foot ulcer Electronic Signature(s) Signed: 12/18/2016 7:53:49 AM By: Linton Ham MD Entered By: Linton Ham on 12/17/2016 09:57:04 Brendan Price ([id number]) -------------------------------------------------------------------------------- Debridement Details Patient Name: Brendan Price Date of Service: 12/17/2016 9:15 AM Medical Record Number: [id number] Patient Account Number: [id number] Date of Birth/Sex: [birth year] (46 y.o. Male) Treating RN: Baruch Gouty, RN, BSN, Velva Harman Primary Care Provider: Lavone Orn Other Clinician: Referring Provider: Lavone Orn Treating Provider/Extender: Tito Dine in Treatment: 6 Debridement Performed for Wound #1 Left,Lateral Foot Assessment: Performed By: Physician Ricard Dillon, MD Debridement: Open Wound/Selective Debridement Selective Description: Pre-procedure Yes - 09:39 Verification/Time Out Taken: Start Time: 09:39 Pain Control: Lidocaine 4% Topical Solution Level: Non-Viable Tissue Total Area Debrided (L x 0.8 (cm) x 1.5 (cm) = 1.2 (cm) W): Tissue and other Non-Viable, Eschar, Fibrin/Slough, Subcutaneous material debrided: Instrument: Curette Bleeding: Minimum Hemostasis Achieved: Pressure End Time: 09:41 Procedural Pain: 0 Post Procedural Pain: 0 Response to Treatment: Procedure was tolerated well Post Debridement Measurements of Total  Wound Length: (cm) 0.8 Width: (cm) 1.5 Depth: (cm) 0.1 Volume: (cm) 0.094 Character of Wound/Ulcer Post Stable Debridement: Severity of Tissue Post Debridement: Fat layer exposed Post Procedure Diagnosis Same as Pre-procedure Electronic Signature(s) Signed: 12/17/2016 4:25:14 PM By: Regan Lemming BSN, RN Signed: 12/18/2016 7:53:49 AM By: Linton Ham MD Brendan Price ([id number]) Entered By: Linton Ham on 12/17/2016 09:56:53 Brendan Price ([id number]) -------------------------------------------------------------------------------- HPI Details Patient Name: Brendan Price Date of Service: 12/17/2016 9:15 AM Medical Record Number: [id number] Patient Account Number: [id number] Date of Birth/Sex: 1971/05/03 (46 y.o. 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 He states his problem began in December 2017 with a plantar wart on the lateral aspect of his left midfoot. He had a application of liquid nitrogen by Dr. Allyson Sabal his dermatologist. Then subsequently he used some form of topical agent which caused a large blister and denuded skin in this area. For a while he was using Neosporin on this area. Sometime in early March she started developing erythema and pain and he was seen in the emergency room on 3/10 what looks like extensive cellulitis on the lateral to mid part of his foot extending into his lower lateral ankle. At that point over the now lateral foot there was a necrotic surface noted by the pictures and Epic. He was given IV antibiotics in the ER and I think Dr. Johnnye Sima gave him doxycycline. Culture from 3/12 showed methicillin sensitive staph aureus and he is been on Augmentin for about 4 days now. The  erythema here is considerably better. Using Xeroform to the wound. The patient is not a diabetic and does not smoke. No relevant wound history that I'm aware of. 11/12/16; wound bed is improved. There is no erythema around the wound, I extended his Augmentin last week but I think that can  stop now. He has been using Santyl. 11/19/16- patient is here for follow-up evaluation of his left lateral foot ulcer. He has been using Santyl daily. He did return to work yesterday and was able to elevate his like thought the day. He did notice an increase in pain with ambulation, primarily with the distance to his car versus ambulating within the pharmacy. He is voicing no complaints or concerns. He has been 1 week off of his antibiotics. 11/26/16; the patient noted erythema last week and called Dr. Johnnye Sima of infectious disease. He is apparently now back on Augmentin. The erythema is improved. 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 He has still been using Santyl changing and every day 12/03/16; the patient completed his Augmentin on Tuesday. We have been using Santyl changing every day. 12/10/16; the patient has no evidence of ongoing infection in the lateral left foot. The wound is contracting and appears healthy. Using Prisma. 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 The wound seems to be contracting. There wasn't surface eschar that required removal he is using Prisma and appears to be doing well Electronic Signature(s) Signed: 12/18/2016 7:53:49 AM By: Linton Ham MD Entered By: Linton Ham on 12/17/2016 09:57:35 Brendan Price ([id number]) -------------------------------------------------------------------------------- Physical Exam Details Patient Name:  Brendan Price Date of Service: 12/17/2016 9:15 AM Medical Record Number: [id number] Patient Account Number: [id number] Date of Birth/Sex: August 29, 1970 (46 y.o. Male) Treating RN: Baruch Gouty, RN, BSN, Velva Harman Primary Care Provider: Lavone Orn Other Clinician: Referring Provider: Lavone Orn Treating Provider/Extender: Ricard Dillon Weeks in Treatment: 6 Constitutional Sitting or standing Blood Pressure is within target range for patient.. Pulse regular and within target range for patient.Marland Kitchen Respirations regular, non-labored and within target range.. Temperature is normal and within the target range for the patient.. Patient's appearance is neat and clean. Appears in no acute distress. Well nourished and well developed.. Notes Wound exam; lateral aspect of his left foot the wound appears to be making excellent progress with advancing epithelialization. Some surface eschar removed from the wound edges there is no opening underneath this. Small open areas remaining within the wound circumference however that everything looks better here. No infection Electronic Signature(s) Signed: 12/18/2016 7:53:49 AM By: Linton Ham MD Entered By: Linton Ham on 12/17/2016 09:58:19 Brendan Price ([id number]) -------------------------------------------------------------------------------- Physician Orders Details Patient Name: Brendan Price Date of Service: 12/17/2016 9:15 AM Medical Record Number: [id number] Patient Account Number: [id number] Date of Birth/Sex: [birth year] (46 y.o. Male) Treating RN: Afful, RN, BSN, [place] Primary Care Provider: Lavone Orn Other Clinician: Referring Provider: Lavone Orn Treating Provider/Extender: Tito Dine in Treatment: 6 Verbal / Phone Orders: No Diagnosis Coding Wound Cleansing Wound #1 Left,Lateral Foot o Cleanse wound with mild soap and water Anesthetic Wound #1 Left,Lateral Foot o Topical Lidocaine 4% cream applied to wound bed  prior to debridement Primary Wound Dressing Wound #1 Left,Lateral Foot o Hydrogel o Promogran Secondary Dressing Wound #1 Left,Lateral Foot o Gauze and Kerlix/Conform Dressing Change Frequency Wound #1 Left,Lateral Foot o Change dressing every day. Follow-up Appointments Wound #1 Left,Lateral Foot o Return Appointment in 1 week. Additional Orders / Instructions Wound #1 Left,Lateral Foot o Increase protein intake. o Activity as tolerated Electronic Signature(s) Signed: 12/17/2016 4:25:14  PM By: Regan Lemming BSN, RN Signed: 12/18/2016 7:53:49 AM By: Linton Ham MD Brendan Price ([id number]) Entered By: Regan Lemming on 12/17/2016 09:43:16 Brendan Price ([id number]) -------------------------------------------------------------------------------- Problem List Details Patient Name: Brendan Price Date of Service: 12/17/2016 9:15 AM Medical Record Number: [id number] Patient Account Number: [id number] Date of Birth/Sex: 20-Apr-1971 (46 y.o. 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 He had a application of liquid nitrogen by Dr. Allyson Sabal his dermatologist. Then subsequently he used some form of topical agent which caused a large blister and denuded skin in this area. For a while he was using Neosporin on this area. Sometime in early March she started developing erythema and pain and he was seen in the emergency room on 3/10 what looks like extensive cellulitis on the lateral to mid part of his foot extending into his lower lateral ankle. At that point over the now lateral foot there was a necrotic surface noted by the pictures and Epic. He was given IV antibiotics in the ER and I think Dr. Johnnye Sima gave him doxycycline. Culture from 3/12 showed methicillin sensitive staph aureus and he is been on Augmentin for about 4 days now. The erythema here is considerably better. Using Xeroform to the wound. The patient is not a diabetic and does not smoke. No relevant wound history that I'm aware of. 11/12/16; wound bed is improved. There is no erythema around the wound, I extended his Augmentin last week but I think that can stop now. He has been using Santyl. 11/19/16- patient is here for follow-up evaluation of his left lateral foot ulcer. He has been using Santyl daily. He did return to work yesterday and was able to elevate his like thought the day. He did notice an increase in pain with ambulation, primarily with the distance to his car versus  ambulating within the pharmacy. He is voicing no complaints or concerns. He has been 1 week off of his antibiotics. 11/26/16; the patient noted erythema last week and called Dr. Johnnye Sima of infectious disease. He is apparently now back on Augmentin. The erythema is improved. 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 He has still been using Santyl changing and every day 12/03/16; the patient completed his Augmentin on Tuesday. We have been using Santyl changing every day. 12/10/16;  the patient has no evidence of ongoing infection in the lateral left foot. The wound is contracting and appears healthy. Using Prisma. 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 The wound seems to be contracting. There wasn't surface eschar that required removal he is using Prisma and appears to be doing well Akerson, Foy G. ([id number]) Objective Constitutional Sitting or standing Blood Pressure is within target range for patient.. Pulse regular and within target range for patient.Marland Kitchen Respirations regular, non-labored and within target range.. Temperature is normal and within the target range for the patient.. Patient's appearance is neat and clean. Appears in no acute distress. Well nourished and well developed.. Vitals Time Taken: 9:15 AM, Height: 71 in, Weight: 174 lbs, BMI: 24.3, Temperature: 98.3 F, Pulse: 87 bpm, Respiratory Rate: 17 breaths/min, Blood Pressure: 130/92 mmHg. General Notes: Wound exam; lateral aspect of his left foot the wound appears to be making excellent progress with advancing epithelialization. Some surface eschar removed from the wound edges there is no opening underneath this. Small open areas remaining within the wound circumference however  that everything looks better here. No infection Integumentary (Hair, Skin) Wound #1 status is Open. Original cause of wound was Gradually Appeared. The wound is located on the Left,Lateral Foot. The wound measures 0.8cm length x 1.5cm width x 0.1cm depth; 0.942cm^2 area and 0.094cm^3 volume. There is Fat Layer (Subcutaneous Tissue) Exposed exposed. There is no tunneling or undermining noted. There is a medium amount of serosanguineous drainage noted. The wound margin is flat and intact. There is medium (34-66%) pink, pale granulation within the wound bed. There is a medium (34-66%) amount of necrotic tissue within the wound bed including Adherent Slough. The periwound skin appearance did not exhibit: Callus, Crepitus, Excoriation, Induration, Rash, Scarring, Dry/Scaly, Maceration, Atrophie Blanche, Cyanosis, Ecchymosis, Hemosiderin Staining, Mottled, Pallor, Rubor, Erythema. Periwound temperature was noted as Hot. The periwound has tenderness on palpation. 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 ([id number]) Wound #1 Wound #1 is a Cyst located on the Left,Lateral Foot . There was a Non-Viable Tissue Open Wound/Selective [phone]) debridement with total area of 1.2 sq cm performed by Ricard Dillon, MD. with the following instrument(s): Curette to remove Non-Viable tissue/material including Fibrin/Slough, Eschar, and Subcutaneous after achieving pain control using Lidocaine 4% Topical Solution. A time out was conducted at 09:39, prior to the start of the procedure. A Minimum amount of bleeding was controlled with Pressure. The procedure was tolerated well with a pain level of 0 throughout and a pain level of 0 following the procedure. Post Debridement Measurements: 0.8cm length x 1.5cm width x 0.1cm  depth; 0.094cm^3 volume. Character of Wound/Ulcer Post Debridement is stable. Severity of Tissue Post Debridement is: Fat layer exposed. Post procedure Diagnosis Wound #1: Same as Pre-Procedure Plan Wound Cleansing: Wound #1 Left,Lateral Foot: Cleanse wound with mild soap and water Anesthetic: Wound #1 Left,Lateral Foot: Topical Lidocaine 4% cream applied to wound bed prior to debridement Primary Wound Dressing: Wound #1 Left,Lateral Foot: Hydrogel Promogran Secondary Dressing: Wound #1 Left,Lateral Foot: Gauze and Kerlix/Conform Dressing Change Frequency: Wound #1 Left,Lateral Foot: Change dressing every day. Follow-up Appointments: Wound #1 Left,Lateral Foot:  Return Appointment in 1 week. Additional Orders / Instructions: Wound #1 Left,Lateral Foot: Increase protein intake. Activity as tolerated Grismore, Nissim G. ([id number]) #1 I think we continue with the same dressing which is Promogran hydrogel. I would look at healing in the next week or 2 Electronic Signature(s) Signed: 12/18/2016 7:53:49 AM By: Linton Ham MD Entered By: Linton Ham on 12/17/2016 09:58:57 Brendan Price ([id number]) -------------------------------------------------------------------------------- [place] Details Patient Name: Brendan Price Date of Service: 12/17/2016 Medical Record Number: [id number] Patient Account Number: [id number] Date of Birth/Sex: Dec 23, 1970 (46 y.o. Male) Treating RN: Baruch Gouty, RN, BSN, Velva Harman Primary Care Provider: Lavone Orn Other Clinician: Referring Provider: Lavone Orn Treating Provider/Extender: Tito Dine in Treatment: 6 Diagnosis Coding ICD-10 Codes Code Description [phone] Non-pressure chronic ulcer of other part of left foot limited to breakdown of skin L03.116 Cellulitis of left lower limb B07.0 Plantar wart Facility Procedures CPT4: Description Modifier Quantity Code [id number] - DEBRIDE WOUND 1ST 20 SQ CM OR < 1 ICD-10  Description Diagnosis L97.521 Non-pressure chronic ulcer of other part of left foot limited to breakdown of skin Physician Procedures CPT4: Description Modifier Quantity Code [phone] [id number] - WC PHYS DEBR WO ANESTH 20 SQ CM 1 ICD-10 Description Diagnosis L97.521 Non-pressure chronic ulcer of other part of left foot limited to breakdown of skin Electronic Signature(s) Signed: 12/18/2016 7:53:49 AM By: Linton Ham MD Entered By: Linton Ham on 12/17/2016 09:59:14
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
## 2016-12-24 ENCOUNTER — Encounter: Payer: PRIVATE HEALTH INSURANCE | Admitting: Internal Medicine

## 2016-12-24 DIAGNOSIS — L97521 Non-pressure chronic ulcer of other part of left foot limited to breakdown of skin: Secondary | ICD-10-CM | POA: Diagnosis not present

## 2016-12-26 ENCOUNTER — Other Ambulatory Visit: Payer: Self-pay | Admitting: Infectious Diseases

## 2016-12-26 DIAGNOSIS — B20 Human immunodeficiency virus [HIV] disease: Secondary | ICD-10-CM

## 2016-12-26 NOTE — Progress Notes (Signed)
[name], [name] ([id number]) Visit Report for 12/24/2016 Chief Complaint Document Details Patient Name: Brendan Price, Brendan Price. Date of Service: 12/24/2016 9:15 AM Medical Record Number: [id number] Patient Account Number: [id number] Date of Birth/Sex: 1970/08/29 (46 y.o. Male) Treating RN: Baruch Gouty, RN, BSN, Velva Harman Primary Care Provider: Lavone Orn Other Clinician: Referring Provider: Lavone Orn Treating Provider/Extender: Tito Dine in Treatment: 7 Information Obtained from: Patient Chief Complaint patient is here for follow up evaluation of his left lateral foot ulcer Electronic Signature(s) Signed: 12/25/2016 12:30:45 PM By: Linton Ham MD Entered By: Linton Ham on 12/24/2016 10:12:17 Brendan Price ([id number]) -------------------------------------------------------------------------------- HPI Details Patient Name: Brendan Price Date of Service: 12/24/2016 9:15 AM Medical Record Number: [id number] Patient Account Number: [id number] Date of Birth/Sex: 1971-06-29 (46 y.o. 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 He states his problem began in December 2017 with a plantar wart on the lateral aspect of his left midfoot. He had a application of liquid nitrogen by Dr. Allyson Sabal his dermatologist. Then subsequently he used some form of topical agent which caused a large blister and denuded skin in this area. For a while he was using Neosporin on this area. Sometime in early March she started developing erythema and pain and he was seen in the emergency room on 3/10 what looks like extensive cellulitis on the lateral to mid part of his foot extending into his lower lateral ankle. At that point over the now  lateral foot there was a necrotic surface noted by the pictures and Epic. He was given IV antibiotics in the ER and I think Dr. Johnnye Sima gave him doxycycline. Culture from 3/12 showed methicillin sensitive staph aureus and he is been on Augmentin for about 4 days now. The erythema here is considerably better. Using Xeroform to the wound. The patient is not a diabetic and does not smoke. No relevant wound history that I'm aware of. 11/12/16; wound bed is improved. There is no erythema around the wound, I extended his Augmentin last week but I think that can stop now. He has been using Santyl. 11/19/16- patient is here for follow-up evaluation of his left lateral foot ulcer. He has been using Santyl daily. He did return to work yesterday and was able to elevate his like thought the day. He did notice an increase in pain with ambulation, primarily with the distance to his car versus ambulating within the pharmacy. He is voicing no complaints or concerns. He has been 1 week off of his antibiotics. 11/26/16; the patient noted erythema last week and called Dr. Johnnye Sima of infectious disease. He is apparently now back on Augmentin. The erythema is improved. 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 He has still been using Santyl changing and every day 12/03/16; the patient completed his Augmentin on Tuesday. We have been using Santyl changing every day. 12/10/16; the patient has no evidence of ongoing infection in the lateral left foot. The wound is contracting and appears healthy. Using Prisma. 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 The wound  seems to be contracting. There wasn't surface eschar that required removal he is using Prisma and appears to be doing well 12/24/16;  continued improvement in the right lateral foot epithelialization. Electronic Signature(s) Signed: 12/25/2016 12:30:45 PM By: Linton Ham MD Entered By: Linton Ham on 12/24/2016 10:13:19 Brendan Price ([id number]) -------------------------------------------------------------------------------- Physical Exam Details Patient Name: Brendan Price Date of Service: 12/24/2016 9:15 AM Medical Record Number: [id number] Patient Account Number: [id number] Date of Birth/Sex: 1971-03-04 (46 y.o. Male) Treating RN: Baruch Gouty, RN, BSN, Velva Harman Primary Care Provider: Lavone Orn Other Clinician: Referring Provider: Lavone Orn Treating Provider/Extender: Ricard Dillon Weeks in Treatment: 7 Constitutional Sitting or standing Blood Pressure is within target range for patient.. Pulse regular and within target range for patient.Marland Kitchen Respirations regular, non-labored and within target range.. Temperature is normal and within the target range for the patient.. Patient's appearance is neat and clean. Appears in no acute distress. Well nourished and well developed.. Cardiovascular Pedal pulses palpable and strong bilaterally.. Lymphatic none palpable in teh popliteal or inguinal area. Integumentary (Hair, Skin) still extensive plantar wart around the wound. Notes wound exam; doing well. most of the woujnd areas has health epithelium. Midle area of the original wound still looks vuneralbe and justifies another week of primary treatment Electronic Signature(s) Signed: 12/25/2016 12:30:45 PM By: Linton Ham MD Entered By: Linton Ham on 12/24/2016 10:15:44 Brendan Price ([id number]) -------------------------------------------------------------------------------- Physician Orders Details Patient Name: Brendan Price Date of Service: 12/24/2016 9:15 AM Medical Record Number: [id number] Patient Account Number: [id number] Date of Birth/Sex: Aug 21, 1970 (46 y.o. Male) Treating RN: Baruch Gouty, RN, BSN,  [place] Primary Care Provider: Lavone Orn Other Clinician: Referring Provider: Lavone Orn Treating Provider/Extender: Tito Dine in Treatment: 7 Verbal / Phone Orders: No Diagnosis Coding ICD-10 Coding Code Description L97.521 Non-pressure chronic ulcer of other part of left foot limited to breakdown of skin L03.116 Cellulitis of left lower limb B07.0 Plantar wart Wound Cleansing Wound #1 Left,Lateral Foot o Cleanse wound with mild soap and water Anesthetic Wound #1 Left,Lateral Foot o Topical Lidocaine 4% cream applied to wound bed prior to debridement Primary Wound Dressing Wound #1 Left,Lateral Foot o Hydrogel o Promogran Secondary Dressing Wound #1 Left,Lateral Foot o Gauze and Kerlix/Conform o Other - [place] in clinic Dressing Change Frequency Wound #1 Left,Lateral Foot o Change dressing every day. Follow-up Appointments Wound #1 Left,Lateral Foot o Return Appointment in 1 week. Additional Orders / Instructions Wound #1 Left,Lateral Foot Pica, Masahiro G. ([id number]) o Increase protein intake. o Activity as tolerated Electronic Signature(s) Signed: 12/24/2016 5:27:57 PM By: Regan Lemming BSN, RN Signed: 12/25/2016 12:30:45 PM By: Linton Ham MD Previous Signature: 12/24/2016 10:40:28 AM Version By: Regan Lemming BSN, RN Entered By: Regan Lemming on 12/24/2016 10:43:51 Brendan Price ([id number]) -------------------------------------------------------------------------------- Problem List Details Patient Name: Brendan Price, Brendan Price. Date of Service: 12/24/2016 9:15 AM Medical Record Number: [id number] Patient Account Number: [id number] Date of Birth/Sex: [birth year] (46 y.o. 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 Date of Service: 12/24/2016 9:15 AM Medical Record Number: [id number] Patient Account Number: [id number] Date of Birth/Sex: [birth year] (46 y.o. 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 He states his problem began in December 2017 with a plantar wart on the lateral aspect of his left midfoot. He had a application of liquid nitrogen by Dr. Allyson Sabal his dermatologist. Then subsequently he used some form of topical agent which caused a large blister and denuded skin in this area. For a while he was using Neosporin on this area. Sometime in early March she started developing erythema and pain and he was seen in the emergency room on 3/10 what looks like extensive cellulitis on the lateral to mid part of his foot extending into his lower lateral ankle. At that point over the now lateral foot there was a necrotic surface noted by the pictures and Epic. He was given IV antibiotics in the ER and I think Dr. Johnnye Sima gave him doxycycline. Culture from 3/12 showed methicillin sensitive staph  aureus and he is been on Augmentin for about 4 days now. The erythema here is considerably better. Using Xeroform to the wound. The patient is not a diabetic and does not smoke. No relevant wound history that I'm aware of. 11/12/16; wound bed is improved. There is no erythema around the wound, I extended his Augmentin last week but I think that can stop now. He has been using Santyl. 11/19/16- patient is here for follow-up evaluation of his left lateral foot ulcer. He has been using Santyl daily. He did return to work yesterday and was able to elevate his like thought the day. He did notice an increase in pain with ambulation, primarily with the distance to his car versus ambulating within the pharmacy. He is voicing no complaints or concerns. He has been 1 week off of his antibiotics. 11/26/16; the patient noted erythema last week and called Dr. Johnnye Sima of infectious disease. He is apparently now back on Augmentin. The erythema is improved. 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 He has still been using Santyl changing and every day 12/03/16; the patient completed his Augmentin on Tuesday. We have been using Santyl changing every day. 12/10/16; the patient has no evidence of ongoing infection in the lateral left foot. The wound is contracting and appears healthy. Using Prisma. 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 The wound seems to be contracting. There wasn't surface eschar that required removal he is using Prisma and appears to be doing well 12/24/16; continued improvement in the right lateral foot epithelialization. Brendan Price, Brendan Price ([id number]) Objective Constitutional Sitting or standing Blood Pressure is within target range for patient.. Pulse regular  and within target range for patient.Marland Kitchen Respirations regular, non-labored and within target range.. Temperature is normal and within the target range for the patient.. Patient's appearance is neat and clean. Appears in no acute distress. Well nourished and well  developed.. Vitals Time Taken: 9:13 AM, Height: 71 in, Weight: 174 lbs, BMI: 24.3, Temperature: 98.1 F, Pulse: 85 bpm, Respiratory Rate: 18 breaths/min, Blood Pressure: 132/90 mmHg. Cardiovascular Pedal pulses palpable and strong bilaterally.. Lymphatic none palpable in teh popliteal or inguinal area. General Notes: wound exam; doing well. most of the woujnd areas has health epithelium. Midle area of the original wound still looks vuneralbe and justifies another week of primary treatment Integumentary (Hair, Skin) still extensive plantar wart around the wound. Wound #1 status is Open. Original cause of wound was Gradually Appeared. The wound is located on the Left,Lateral Foot. The wound measures 0.4cm length x 0.4cm width x 0.1cm depth; 0.126cm^2 area and 0.013cm^3 volume. There is Fat Layer (Subcutaneous Tissue) Exposed exposed. There is no tunneling or undermining noted. There is a small amount of serosanguineous drainage noted. The wound margin is flat and intact. There is medium (34-66%) pink, pale granulation within the wound bed. There is a medium (34- 66%) amount of necrotic tissue within the wound bed including Adherent Slough. The periwound skin appearance did not exhibit: Callus, Crepitus, Excoriation, Induration, Rash, Scarring, Dry/Scaly, Maceration, Atrophie Blanche, Cyanosis, Ecchymosis, Hemosiderin Staining, Mottled, Pallor, Rubor, Erythema. Periwound temperature was noted as Hot. The periwound has tenderness on palpation. 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 ([id number]) Plan Wound Cleansing: Wound #1 Left,Lateral Foot: Cleanse wound with mild soap and water Anesthetic: Wound #1 Left,Lateral Foot: Topical Lidocaine 4% cream applied to wound bed prior to debridement Primary Wound Dressing: Wound #1 Left,Lateral Foot: Hydrogel Promogran Secondary Dressing: Wound #1 Left,Lateral Foot: Gauze and Kerlix/Conform Other - [place] in clinic Dressing Change Frequency: Wound #1 Left,Lateral Foot: Change dressing every day. Follow-up Appointments: Wound #1 Left,Lateral Foot: Return Appointment in 1 week. Additional Orders / Instructions: Wound #1 Left,Lateral Foot: Increase protein intake. Activity as tolerated doing well no change collagen and border foam should be done by next week Electronic Signature(s) Signed: 12/25/2016 11:45:37 AM By: Gretta Cool RN, BSN, Kim RN, BSN Signed: 12/25/2016 12:30:45 PM By: Linton Ham MD Entered By: Gretta Cool, RN, BSN, Kim on 12/25/2016 11:45:36 Brendan Price ([id number]) -------------------------------------------------------------------------------- [place] Details Patient Name: Brendan Price Date of Service: 12/24/2016 Medical Record Number: [id number] Patient Account Number: [id number] Date of Birth/Sex: 1971/01/22 (46 y.o. Male) Treating RN: Baruch Gouty, RN, BSN, Velva Harman Primary Care Provider: Lavone Orn Other Clinician: Referring Provider: Lavone Orn Treating Provider/Extender: Tito Dine in Treatment: 7 Diagnosis Coding ICD-10 Codes Code Description [id number] Non-pressure chronic ulcer of other part of left foot limited to breakdown of skin L03.116 Cellulitis of left lower limb B07.0 Plantar wart Facility Procedures CPT4 Code: [id number] Description: 33825 - WOUND CARE VISIT-LEV 2 EST PT Modifier: Quantity: 1 Physician Procedures CPT4: Description Modifier Quantity Code [id number] - WC PHYS LEVEL 4 - EST PT 1 ICD-10 Description Diagnosis L97.521 Non-pressure chronic  ulcer of other part of left foot limited to breakdown of skin L03.116 Cellulitis of left lower limb Electronic Signature(s) Signed: 12/24/2016 10:41:32 AM By: Regan Lemming BSN, RN Signed: 12/25/2016 12:30:45 PM By: Linton Ham MD Entered By: Regan Lemming on 12/24/2016 10:41:32
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
## 2016-12-31 ENCOUNTER — Encounter: Payer: PRIVATE HEALTH INSURANCE | Admitting: Internal Medicine

## 2016-12-31 DIAGNOSIS — L97521 Non-pressure chronic ulcer of other part of left foot limited to breakdown of skin: Secondary | ICD-10-CM | POA: Diagnosis not present
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
## 2017-01-01 NOTE — Progress Notes (Signed)
MICHIAL, DISNEY ([id number]) Visit Report for 12/31/2016 Chief Complaint Document Details Patient Name: Brendan Price, Brendan Price. Date of Service: 12/31/2016 9:15 AM Medical Record Number: [id number] Patient Account Number: [id number] Date of Birth/Sex: Nov 30, 1970 (46 y.o. Male) Treating RN: Cornell Barman Primary Care Provider: Lavone Orn Other Clinician: Referring Provider: Lavone Orn Treating Provider/Extender: Tito Dine in Treatment: 8 Information Obtained from: Patient Chief Complaint patient is here for follow up evaluation of his left lateral foot ulcer Electronic Signature(s) Signed: 12/31/2016 4:47:06 PM By: Linton Ham MD Entered By: Linton Ham on 12/31/2016 09:57:23 Brendan Price ([id number]) -------------------------------------------------------------------------------- HPI Details Patient Name: Brendan Price Date of Service: 12/31/2016 9:15 AM Medical Record Number: [id number] Patient Account Number: [id number] Date of Birth/Sex: Jul 12, 1971 (46 y.o. 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 He states his problem began in December 2017 with a plantar wart on the lateral aspect of his left midfoot. He had a application of liquid nitrogen by Dr. Allyson Sabal his dermatologist. Then subsequently he used some form of topical agent which caused a large blister and denuded skin in this area. For a while he was using Neosporin on this area. Sometime in early March she started developing erythema and pain and he was seen in the emergency room on 3/10 what looks like extensive cellulitis on the lateral to mid part of his foot extending into his lower lateral ankle. At that point over the now lateral foot there  was a necrotic surface noted by the pictures and Epic. He was given IV antibiotics in the ER and I think Dr. Johnnye Sima gave him doxycycline. Culture from 3/12 showed methicillin sensitive staph aureus and he is been on Augmentin for about 4 days now. The erythema here is considerably better. Using Xeroform to the wound. The patient is not a diabetic and does not smoke. No relevant wound history that I'm aware of. 11/12/16; wound bed is improved. There is no erythema around the wound, I extended his Augmentin last week but I think that can stop now. He has been using Santyl. 11/19/16- patient is here for follow-up evaluation of his left lateral foot ulcer. He has been using Santyl daily. He did return to work yesterday and was able to elevate his like thought the day. He did notice an increase in pain with ambulation, primarily with the distance to his car versus ambulating within the pharmacy. He is voicing no complaints or concerns. He has been 1 week off of his antibiotics. 11/26/16; the patient noted erythema last week and called Dr. Johnnye Sima of infectious disease. He is apparently now back on Augmentin. The erythema is improved. 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 He has still been using Santyl changing and every day 12/03/16; the patient completed his Augmentin on Tuesday. We have been using Santyl changing every day. 12/10/16; the patient has no evidence of ongoing infection in the lateral left foot. The wound is contracting and appears healthy. Using Prisma. 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 The wound seems to be contracting.  There wasn't surface eschar that required removal he is using Prisma and appears to be doing well 12/24/16; continued  improvement in the right lateral foot epithelialization. 12/31/16; right lateral foot which was a surgical injury initially with secondary infection as totally epithelialized. There is no open area here no tenderness. Electronic Signature(s) Signed: 12/31/2016 4:47:06 PM By: Linton Ham MD Entered By: Linton Ham on 12/31/2016 09:58:55 Brendan Price ([id number]) -------------------------------------------------------------------------------- Physical Exam Details Patient Name: Brendan Price Date of Service: 12/31/2016 9:15 AM Medical Record Number: [id number] Patient Account Number: [id number] Date of Birth/Sex: 08-21-1970 (46 y.o. Male) Treating RN: Cornell Barman Primary Care Provider: Lavone Orn Other Clinician: Referring Provider: Lavone Orn Treating Provider/Extender: Tito Dine in Treatment: 8 Constitutional Patient is hypertensive.. Pulse regular and within target range for patient.Marland Kitchen Respirations regular, non-labored and within target range.. Temperature is normal and within the target range for the patient.. Patient's appearance is neat and clean. Appears in no acute distress. Well nourished and well developed.. Cardiovascular Pedal pulses palpable and strong bilaterally.. Notes Wound exam: area is fully epithelialized. no tenderness. no infection. Electronic Signature(s) Signed: 12/31/2016 4:47:06 PM By: Linton Ham MD Entered By: Linton Ham on 12/31/2016 10:00:11 Brendan Price ([id number]) -------------------------------------------------------------------------------- Physician Orders Details Patient Name: Brendan Price Date of Service: 12/31/2016 9:15 AM Medical Record Number: [id number] Patient Account Number: [id number] Date of Birth/Sex: [birth year] (46 y.o. Male) Treating RN: Cornell Barman Primary Care Provider: Lavone Orn Other Clinician: Referring Provider: Lavone Orn Treating Provider/Extender: Tito Dine in  Treatment: 8 Verbal / Phone Orders: Yes Clinician: Cornell Barman Read Back and Verified: No Diagnosis Coding Discharge From [facility] Wound #1 Left,Lateral Foot o Discharge from [facility] Signature(s) Signed: 12/31/2016 4:47:06 PM By: Linton Ham MD Signed: 12/31/2016 5:19:45 PM By: Gretta Cool RN, BSN, Kim RN, BSN Entered By: Gretta Cool, RN, BSN, Kim on 12/31/2016 09:23:52 Brendan Price ([id number]) -------------------------------------------------------------------------------- Problem List Details Patient Name: Brendan Price Date of Service: 12/31/2016 9:15 AM Medical Record Number: [id number] Patient Account Number: [id number] Date of Birth/Sex: 10-Jan-1971 (46 y.o. 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 He states his problem began in December 2017 with a plantar wart on the lateral aspect of his left midfoot. He had a application of liquid nitrogen by Dr. Allyson Sabal his dermatologist. Then subsequently he used some form of topical agent which caused a large blister and denuded skin in this area. For a while he was using Neosporin on this area.  Sometime in early March she started developing erythema and pain and he was seen in the emergency room on 3/10 what looks like extensive cellulitis on the lateral to mid part of his foot extending into his lower lateral ankle. At that point over the now lateral foot there was a necrotic surface noted by the pictures and Epic. He was given IV antibiotics in the ER and I think Dr. Johnnye Sima gave him doxycycline. Culture from 3/12 showed methicillin sensitive staph aureus and he is been on Augmentin for about 4 days now. The erythema here is considerably better. Using Xeroform to the wound. The patient is not a diabetic and does not smoke. No relevant wound history that I'm aware of. 11/12/16; wound bed is improved. There is no erythema around the wound, I extended his Augmentin last week but I think that can stop now. He has been using Santyl. 11/19/16- patient is here for follow-up evaluation of his left lateral foot ulcer. He has been using Santyl daily. He did return to work yesterday and was able to elevate his like thought the day. He did notice an increase in pain with ambulation, primarily with the distance to his car versus ambulating within the pharmacy. He is voicing no complaints or concerns. He has been 1 week off of his antibiotics. 11/26/16; the patient noted erythema last week and called Dr. Johnnye Sima of infectious disease. He is apparently now back on Augmentin. The erythema is improved. He did return to work but is  managing to Price most of his job while off his foot. He has still been using Santyl changing and every day 12/03/16; the patient completed his Augmentin on Tuesday. We have been using Santyl changing every day. 12/10/16; the patient has no evidence of ongoing infection in the lateral left foot. The wound is contracting and appears healthy. Using Prisma. 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 The wound seems to be contracting. There wasn't surface eschar that required removal he is using Prisma and appears to be doing well 12/24/16; continued improvement in the right lateral foot epithelialization. 12/31/16; right lateral foot which was a surgical injury initially with secondary infection as totally epithelialized. There is no open area here no tenderness. Brendan Price, Brendan Price ([id number]) Objective Constitutional Patient is hypertensive.. Pulse regular and within target range for patient.Marland Kitchen Respirations regular, non-labored and within target range.. Temperature is normal and within the target range for the patient.. Patient's appearance is neat and clean. Appears in no acute distress. Well nourished and well developed.. Vitals Time Taken: 9:05 AM, Height: 71 in, Weight: 174 lbs, BMI: 24.3, Temperature: 98.2 F, Pulse: 83 bpm, Respiratory Rate: 16 breaths/min, Blood Pressure: 137/100 mmHg. Cardiovascular Pedal pulses palpable and strong bilaterally.. General Notes: Wound exam: area is fully epithelialized. no tenderness. no infection. Integumentary (Hair, Skin) Wound #1 status is Healed - Epithelialized. Original cause of wound was Gradually Appeared. The wound is located on the Left,Lateral Foot. The wound measures 0cm length x 0cm width x 0cm depth; 0cm^2 area and 0cm^3 volume. The wound is limited  to skin breakdown. There is no tunneling or undermining noted. There is a small amount of serosanguineous drainage noted. The wound margin is flat and intact. There is no granulation within the wound bed. There is no necrotic tissue within the  wound bed. The periwound skin appearance exhibited: Dry/Scaly, Ecchymosis. The periwound skin appearance did not exhibit: Callus, Crepitus, Excoriation, Induration, Rash, Scarring, Maceration, Atrophie Blanche, Cyanosis, Hemosiderin Staining, Mottled, Pallor, Rubor, Erythema. Periwound temperature was noted as Hot. The periwound has tenderness on palpation. Assessment Active Problems ICD-10 L97.521 - Non-pressure chronic ulcer of other part of left foot limited to breakdown of skin L03.116 - Cellulitis of left lower limb B07.0 - Plantar wart Plan Brendan Price, Brendan Price ([id number]) Discharge From [facility] Services: Wound #1 Left,Lateral Foot: Discharge from [facility] can be discharged keep area protected in his footwear ie foam Electronic Signature(s) Signed: 12/31/2016 4:47:06 PM By: Linton Ham MD Entered By: Linton Ham on 12/31/2016 10:01:07 Brendan Price ([id number]) -------------------------------------------------------------------------------- [place] Details Patient Name: Brendan Price Date of Service: 12/31/2016 Medical Record Number: [id number] Patient Account Number: [id number] Date of Birth/Sex: Oct 13, 1970 (46 y.o. Male) Treating RN: Cornell Barman Primary Care Provider: Lavone Orn Other Clinician: Referring Provider: Lavone Orn Treating Provider/Extender: Tito Dine in Treatment: 8 Diagnosis Coding ICD-10 Codes Code Description [phone] Non-pressure chronic ulcer of other part of left foot limited to breakdown of skin L03.116 Cellulitis of left lower limb B07.0 Plantar wart Facility Procedures CPT4 Code: [id number] Description: [id number] - WOUND CARE VISIT-LEV 2 EST PT Modifier: Quantity: 1 Physician  Procedures CPT4: Description Modifier Quantity Code [id number] - WC PHYS LEVEL 2 - EST PT 1 ICD-10 Description Diagnosis L97.521 Non-pressure chronic ulcer of other part of left foot limited to breakdown of skin L03.116 Cellulitis of left lower limb Electronic Signature(s) Signed: 12/31/2016 4:47:06 PM By: Linton Ham MD Entered By: Linton Ham on 12/31/2016 10:01:23

## 2017-01-07 ENCOUNTER — Ambulatory Visit: Payer: PRIVATE HEALTH INSURANCE | Admitting: Internal Medicine

## 2017-01-14 ENCOUNTER — Ambulatory Visit: Payer: PRIVATE HEALTH INSURANCE | Admitting: Internal Medicine

## 2017-05-11 ENCOUNTER — Encounter (HOSPITAL_COMMUNITY): Payer: Self-pay | Admitting: Emergency Medicine

## 2017-05-11 ENCOUNTER — Ambulatory Visit (HOSPITAL_COMMUNITY)
Admission: EM | Admit: 2017-05-11 | Discharge: 2017-05-11 | Disposition: A | Discharge status: Home / Self Care | Payer: PRIVATE HEALTH INSURANCE | Attending: Urgent Care | Admitting: Urgent Care

## 2017-05-11 DIAGNOSIS — N200 Calculus of kidney: Secondary | ICD-10-CM

## 2017-05-11 DIAGNOSIS — R319 Hematuria, unspecified: Secondary | ICD-10-CM

## 2017-05-11 DIAGNOSIS — R350 Frequency of micturition: Secondary | ICD-10-CM

## 2017-05-11 DIAGNOSIS — R300 Dysuria: Secondary | ICD-10-CM

## 2017-05-11 LAB — POCT URINALYSIS DIP (DEVICE)
Bilirubin Urine: NEGATIVE
GLUCOSE, UA: NEGATIVE mg/dL
KETONES UR: NEGATIVE mg/dL
LEUKOCYTES UA: NEGATIVE
Nitrite: NEGATIVE
PROTEIN: NEGATIVE mg/dL
SPECIFIC GRAVITY, URINE: 1.01 (ref 1.005–1.030)
UROBILINOGEN UA: 0.2 mg/dL (ref 0.0–1.0)
pH: 6.5 (ref 5.0–8.0)

## 2017-05-11 MED ORDER — TAMSULOSIN HCL 0.4 MG PO CAPS: 0.4000 mg | ORAL_CAPSULE | Freq: Every day | ORAL | 0 refills | Status: DC

## 2017-05-11 MED ORDER — TRAMADOL-ACETAMINOPHEN 37.5-325 MG PO TABS: 1.0000 | ORAL_TABLET | Freq: Four times a day (QID) | ORAL | 0 refills | Status: DC | PRN

## 2017-05-11 NOTE — Discharge Instructions (Signed)
Do not use any NSAID processed by the kidneys such as ibuprofen, naproxen, meloxicam, Advil, Motrin, Alleve, Mobic.

## 2017-05-11 NOTE — ED Triage Notes (Signed)
Pt here for UTI sx onset 4 days associated w/urinary freq/urgency, dark and cloudy urine, back pain, hematuria   Denies fevers, n/v/d  Has not had nay meds  Hx of kidney stone  A&O x4... NAD... Ambulatory

## 2017-05-11 NOTE — ED Provider Notes (Signed)
MRN: [id number] DOB: Oct 29, 1970  Subjective:   Brendan Price is a 46 y.o. male presenting for chief complaint of Urinary Tract Infection  Reports 4 day history of dysuria, hematuria, urinary frequency, urinary urgency, flank pain, cloudy malordorous urine and subjective fever. Has not tried any medications for relief. Has a history of renal stones, last episode was 2017, had to have lithotripsy done. Denies abdominal pain, pelvic pain and genital rash, nausea and vomiting.   No current facility-administered medications for this encounter.   Current Outpatient Prescriptions:  .  aspirin 81 MG EC tablet, Take 81 mg by mouth at bedtime., Disp: , Rfl:  .  cetirizine (ZYRTEC) 10 MG tablet, Take 10 mg by mouth 2 (two) times daily.  , Disp: , Rfl:  .  efavirenz (SUSTIVA) 600 MG tablet, TAKE 1 TABLET BY MOUTH EVERY NIGHT AT BEDTIME, Disp: 30 tablet, Rfl: 5 .  famotidine (PEPCID) 20 MG tablet, Take 20 mg by mouth 2 (two) times daily.  , Disp: , Rfl:  .  fluocinonide cream (LIDEX) 1.75 %, Apply 1 application topically 2 (two) times daily., Disp: , Rfl: 2 .  fluticasone (FLONASE) 50 MCG/ACT nasal spray, INSTILL 1- 2 SPRAYS IN EACH NOSTRIL EVERY DAY AS NEEDED, Disp: 16 g, Rfl: 6 .  lamiVUDine-zidovudine (COMBIVIR) 150-300 MG tablet, Take 1 tablet by mouth 2 (two) times daily., Disp: 180 tablet, Rfl: 2 .  LORazepam (ATIVAN) 1 MG tablet, Take 1 mg by mouth every 8 (eight) hours as needed for anxiety. , Disp: , Rfl:  .  Multiple Vitamin (MULTIVITAMIN WITH MINERALS) TABS tablet, Take 2 tablets by mouth daily., Disp: , Rfl:  .  naproxen sodium (ANAPROX) 220 MG tablet, Take 440 mg by mouth 2 (two) times daily with a meal., Disp: , Rfl:  .  omeprazole (PRILOSEC) 20 MG capsule, Take 20 mg by mouth daily.  , Disp: , Rfl:  .  Selenium 200 MCG TABS, Take 200 mcg by mouth daily., Disp: , Rfl:  .  zolpidem (AMBIEN) 10 MG tablet, Take 10 mg by mouth at bedtime as needed for sleep. , Disp: , Rfl:  .   amoxicillin-clavulanate (AUGMENTIN) 875-125 MG tablet, Take 1 tablet by mouth 2 (two) times daily., Disp: 14 tablet, Rfl: 0 .  amoxicillin-clavulanate (AUGMENTIN) 875-125 MG tablet, Take 1 tablet by mouth 2 (two) times daily. To be taken after the previous augmentin prescription for a total of 10 days of treatment., Disp: 6 tablet, Rfl: 0 .  chlorproMAZINE (THORAZINE) 10 MG tablet, Take 10 mg by mouth daily as needed for nausea. , Disp: , Rfl:  .  meloxicam (MOBIC) 15 MG tablet, Take 15 mg by mouth daily as needed for pain., Disp: , Rfl: 5 .  SSD 1 % cream, Apply 1 application topically 2 (two) times daily., Disp: , Rfl: 1 .  valACYclovir (VALTREX) 1000 MG tablet, Take 1,000 mg by mouth 2 (two) times daily as needed., Disp: , Rfl:    Brendan Price is allergic to ampicillin. Brendan Price  has a past medical history of Allergy; GERD (gastroesophageal reflux disease); HIV infection ([place]); Inguinal hernia bilateral, non-recurrent; Kidney stones; Seasonal allergies; and Umbilical hernia. Also  has a past surgical history that includes Ureteroscopy (12/1996); Lithotripsy (2000); Tonsillectomy (1977); Inguinal hernia repair (N/A, 10/19/2013); Insertion of mesh (N/A, 10/19/2013); Hernia repair; and Wisdom tooth extraction.  Objective:   Vitals: BP (!) 148/99 (BP Location: Right Arm)   Pulse 83   Temp 98.6 F (37 C) (Oral)  Resp 20   SpO2 98%   Physical Exam  Constitutional: He is oriented to person, place, and time. He appears well-developed and well-nourished.  HENT:  Mouth/Throat: Oropharynx is clear and moist.  Eyes: No scleral icterus.  Cardiovascular: Normal rate, regular rhythm and intact distal pulses.  Exam reveals no gallop and no friction rub.   No murmur heard. Pulmonary/Chest: No respiratory distress. He has no wheezes. He has no rales.  Abdominal: Soft. Bowel sounds are normal. He exhibits no distension and no mass. There is tenderness (mild upper). There is no guarding.  No CVA tenderness.    Neurological: He is alert and oriented to person, place, and time.  Skin: Skin is warm and dry.  Psychiatric: He has a normal mood and affect.   Results for orders placed or performed during the hospital encounter of 05/11/17 (from the past 24 hour(s))  POCT urinalysis dip (device)     Status: Abnormal   Collection Time: 05/11/17  6:11 PM  Result Value Ref Range   Glucose, UA NEGATIVE NEGATIVE mg/dL   Bilirubin Urine NEGATIVE NEGATIVE   Ketones, ur NEGATIVE NEGATIVE mg/dL   Specific Gravity, Urine 1.010 1.005 - 1.030   Hgb urine dipstick MODERATE (A) NEGATIVE   pH 6.5 5.0 - 8.0   Protein, ur NEGATIVE NEGATIVE mg/dL   Urobilinogen, UA 0.2 0.0 - 1.0 mg/dL   Nitrite NEGATIVE NEGATIVE   Leukocytes, UA NEGATIVE NEGATIVE   Assessment and Plan :   Renal stones  Dysuria  Hematuria, unspecified type  Urinary frequency  X-ray from 04/2016 shows bilateral nephrolithiasis. He has a urologist that has done lithotripsy before. Today, patient declines bloodwork, x-ray. Will use aggressive hydration, Flomax and Ultracet. Patient is not to use NSAIDs. Return-to-clinic precautions discussed, patient verbalized understanding. Otherwise, f/u with urologist.  Jaynee Eagles, PA-C [facility] [phone] 05/11/2017 6:09 PM    Jaynee Eagles, Hershal Coria 05/11/17 1835

## 2017-05-23 ENCOUNTER — Encounter: Payer: Self-pay | Admitting: Infectious Diseases

## 2017-06-23 ENCOUNTER — Ambulatory Visit: Payer: PRIVATE HEALTH INSURANCE | Admitting: Infectious Diseases

## 2017-06-23 ENCOUNTER — Encounter: Payer: Self-pay | Admitting: Infectious Diseases

## 2017-06-23 ENCOUNTER — Other Ambulatory Visit (HOSPITAL_COMMUNITY)
Admission: RE | Admit: 2017-06-23 | Discharge: 2017-06-23 | Disposition: A | Discharge status: Home / Self Care | Payer: PRIVATE HEALTH INSURANCE | Source: Ambulatory Visit | Attending: Infectious Diseases | Admitting: Infectious Diseases

## 2017-06-23 ENCOUNTER — Other Ambulatory Visit: Payer: Self-pay | Admitting: Infectious Diseases

## 2017-06-23 VITALS — BP 139/92 | HR 79 | Temp 97.9°F | Wt 181.1 lb

## 2017-06-23 DIAGNOSIS — B070 Plantar wart: Secondary | ICD-10-CM

## 2017-06-23 DIAGNOSIS — E781 Pure hyperglyceridemia: Secondary | ICD-10-CM | POA: Diagnosis not present

## 2017-06-23 DIAGNOSIS — B20 Human immunodeficiency virus [HIV] disease: Secondary | ICD-10-CM | POA: Insufficient documentation

## 2017-06-23 DIAGNOSIS — L02619 Cutaneous abscess of unspecified foot: Secondary | ICD-10-CM

## 2017-06-23 DIAGNOSIS — E881 Lipodystrophy, not elsewhere classified: Secondary | ICD-10-CM

## 2017-06-23 DIAGNOSIS — L03119 Cellulitis of unspecified part of limb: Secondary | ICD-10-CM

## 2017-06-23 DIAGNOSIS — Z113 Encounter for screening for infections with a predominantly sexual mode of transmission: Secondary | ICD-10-CM

## 2017-06-23 NOTE — Assessment & Plan Note (Signed)
Will check his lipids today.  Cc his PCP

## 2017-06-23 NOTE — Assessment & Plan Note (Signed)
This has resolved and his wound has healed.

## 2017-06-23 NOTE — Addendum Note (Signed)
Addended by: Dolan Amen D on: 06/23/2017 03:43 PM   Modules accepted: Orders

## 2017-06-23 NOTE — Assessment & Plan Note (Signed)
Will recheck his lipids today Cc his PCP

## 2017-06-23 NOTE — Assessment & Plan Note (Signed)
This has resolved with good wound care, help from [facility].

## 2017-06-23 NOTE — Addendum Note (Signed)
Addended by: Landis Gandy on: 06/23/2017 03:38 PM   Modules accepted: Orders

## 2017-06-23 NOTE — Progress Notes (Signed)
   Subjective:    Patient ID: Brendan Price, male    DOB: [birth year], 46 y.o.   MRN: [id number]  HPI 46yo M diagnosed with HIV in 2002. He remains on his initial ART of CBV/EFV Since his last visit July 2017, he has been seen with kidney stone. Has been struggling with cellulitis and wound of his L foot after having a wart frozen off his foot. (~10-20-16) This has completely healed now.  Was seen in ED Sept for dark red urine and pain. Was seen by his uro as well. He had CT in f/u which showed 1 stone of note (on right), several smaller stones. His pain was on L. Was started on tamsulosin. Has f/u 07-03-17.    Would like full panel of labs done for PCP.   HIV 1 RNA Quant (copies/mL)  Date Value  03/06/2016 144 (H)  03/15/2015 <20  05/30/2014 <20   CD4 T Cell Abs (/uL)  Date Value  03/06/2016 840  03/15/2015 740  05/30/2014 810    Review of Systems  Constitutional: Negative for appetite change, chills, fever and unexpected weight change.  Respiratory: Negative for cough and shortness of breath.   Gastrointestinal: Negative for constipation and diarrhea.  Genitourinary: Positive for difficulty urinating, dysuria, flank pain and hematuria.  Psychiatric/Behavioral: Negative for sleep disturbance.  Please see HPI. All other systems reviewed and negative.     Objective:   Physical Exam  Constitutional: He appears well-developed and well-nourished.  HENT:  Mouth/Throat: No oropharyngeal exudate.  Eyes: EOM are normal. Pupils are equal, round, and reactive to light.  Neck: Neck supple.  Cardiovascular: Normal rate, regular rhythm and normal heart sounds.  Pulmonary/Chest: Effort normal and breath sounds normal.  Abdominal: Soft. Bowel sounds are normal. There is no tenderness. There is no rebound and no CVA tenderness.  Musculoskeletal: He exhibits no edema.  Lymphadenopathy:    He has no cervical adenopathy.  Skin: Skin is warm and dry.  Wound is well healed, mild scar. No open  wound.   Psychiatric: He has a normal mood and affect.          Assessment & Plan:

## 2017-06-23 NOTE — Assessment & Plan Note (Addendum)
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  Offered/refused condoms.  Has HIV+ partner for > 3 yrs rtc in 9 months

## 2017-06-24 LAB — CBC
HEMATOCRIT: 36.9 % — AB (ref 38.5–50.0)
Hemoglobin: 14 g/dL (ref 13.2–17.1)
MCH: 46.4 pg — ABNORMAL HIGH (ref 27.0–33.0)
MCHC: 36.9 g/dL — ABNORMAL HIGH (ref 32.0–36.0)
MCV: 122.2 fL — ABNORMAL HIGH (ref 80.0–100.0)
MPV: 10.3 fL (ref 7.5–12.5)
PLATELETS: 227 10*3/uL (ref 140–400)
RBC: 3.02 10*6/uL — ABNORMAL LOW (ref 4.20–5.80)
RDW: 13.1 % (ref 11.0–15.0)
WBC: 4.2 10*3/uL (ref 3.8–10.8)

## 2017-06-24 LAB — T-HELPER CELL (CD4) - (RCID CLINIC ONLY)
CD4 T CELL HELPER: 39 % (ref 33–55)
CD4 T Cell Abs: 770 /uL (ref 400–2700)

## 2017-06-24 LAB — COMPREHENSIVE METABOLIC PANEL
AG RATIO: 1.4 (calc) (ref 1.0–2.5)
ALBUMIN MSPROF: 4.2 g/dL (ref 3.6–5.1)
ALT: 84 U/L — ABNORMAL HIGH (ref 9–46)
AST: 69 U/L — ABNORMAL HIGH (ref 10–40)
Alkaline phosphatase (APISO): 92 U/L (ref 40–115)
BUN: 11 mg/dL (ref 7–25)
CHLORIDE: 107 mmol/L (ref 98–110)
CO2: 23 mmol/L (ref 20–32)
Calcium: 9.3 mg/dL (ref 8.6–10.3)
Creat: 0.62 mg/dL (ref 0.60–1.35)
GLOBULIN: 3 g/dL (ref 1.9–3.7)
GLUCOSE: 182 mg/dL — AB (ref 65–99)
POTASSIUM: 4.1 mmol/L (ref 3.5–5.3)
SODIUM: 138 mmol/L (ref 135–146)
TOTAL PROTEIN: 7.2 g/dL (ref 6.1–8.1)
Total Bilirubin: 0.6 mg/dL (ref 0.2–1.2)

## 2017-06-24 LAB — LIPID PANEL
Cholesterol: 205 mg/dL — ABNORMAL HIGH (ref <200)
HDL: 22 mg/dL — ABNORMAL LOW (ref >40)
Non-HDL Cholesterol (Calc): 183 mg/dL (calc) — ABNORMAL HIGH (ref <130)
TRIGLYCERIDES: 933 mg/dL — AB (ref <150)
Total CHOL/HDL Ratio: 9.3 (calc) — ABNORMAL HIGH (ref <5.0)

## 2017-06-24 LAB — URINE CYTOLOGY ANCILLARY ONLY
Chlamydia: NEGATIVE
Neisseria Gonorrhea: NEGATIVE

## 2017-06-24 LAB — RPR: RPR: NONREACTIVE

## 2017-07-01 ENCOUNTER — Other Ambulatory Visit: Payer: Self-pay | Admitting: Infectious Diseases

## 2017-07-01 DIAGNOSIS — B20 Human immunodeficiency virus [HIV] disease: Secondary | ICD-10-CM

## 2017-07-02 LAB — HIV-1 RNA QUANT-NO REFLEX-BLD
HIV 1 RNA QUANT: DETECTED {copies}/mL — AB
HIV-1 RNA QUANT, LOG: DETECTED {Log_copies}/mL — AB

## 2017-07-03 ENCOUNTER — Encounter: Payer: Self-pay | Admitting: Infectious Diseases

## 2017-07-03 ENCOUNTER — Other Ambulatory Visit: Payer: Self-pay | Admitting: *Deleted

## 2017-07-03 MED ORDER — FLUTICASONE PROPIONATE 50 MCG/ACT NA SUSP: NASAL | 6 refills | Status: DC

## 2017-07-30 ENCOUNTER — Other Ambulatory Visit: Payer: Self-pay | Admitting: Infectious Diseases

## 2017-07-30 DIAGNOSIS — B20 Human immunodeficiency virus [HIV] disease: Secondary | ICD-10-CM

## 2017-09-19 IMAGING — CR DG ANKLE COMPLETE 3+V*R*
3 series · 3 of 3 positions shown · non-contrast
Comparison: None in PACs

CLINICAL DATA: Right lateral ankle injury 10 days ago

EXAM:
RIGHT ANKLE - COMPLETE 3+ VIEW

[t ankle joint ap right]
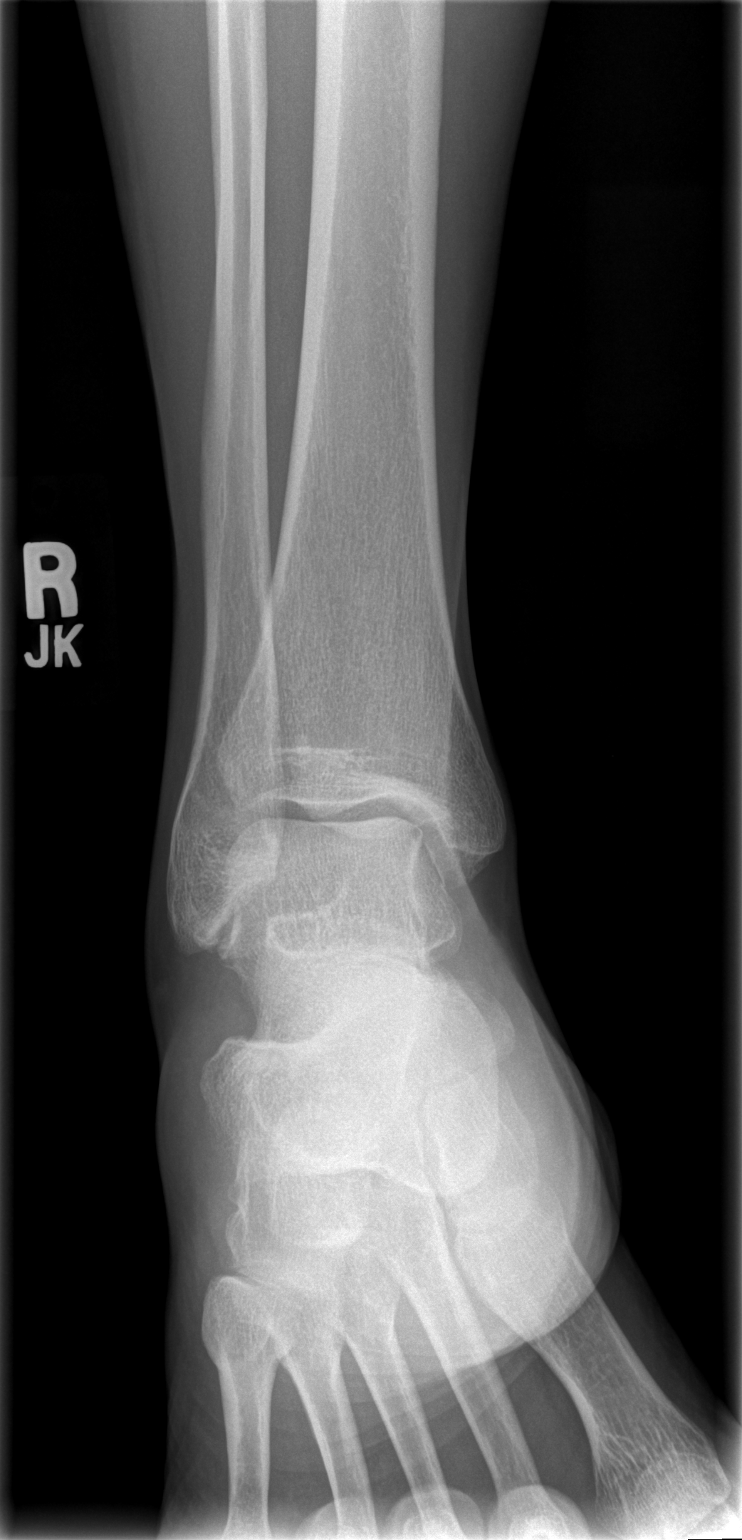

[t ankle joint oblique right]
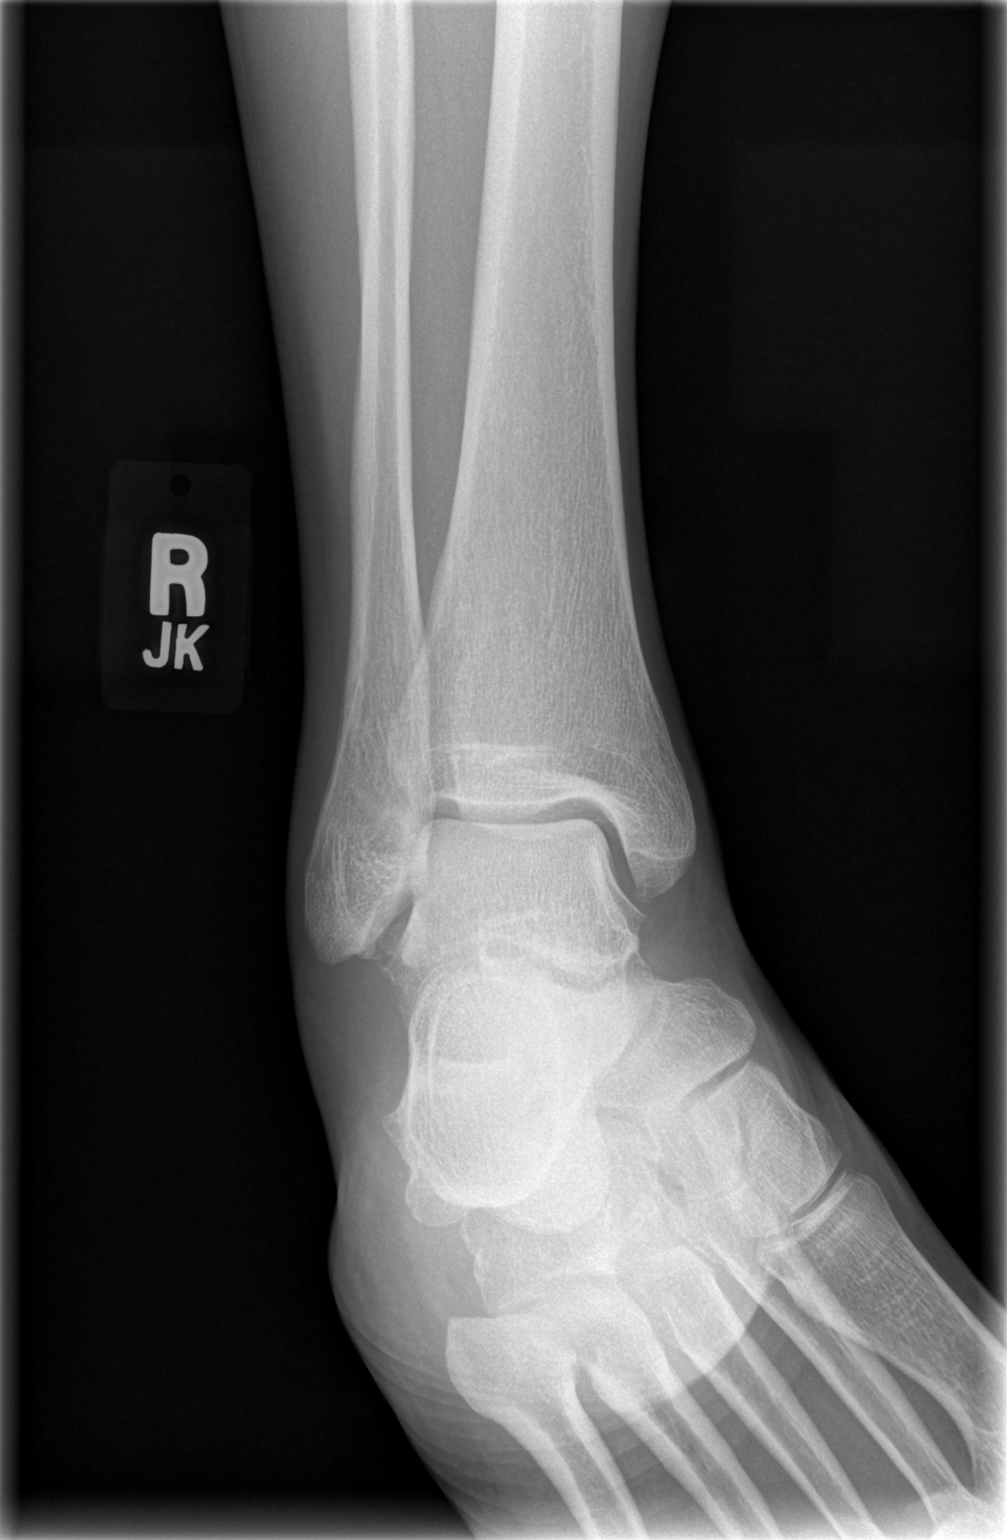

[t ankle joint lat right]
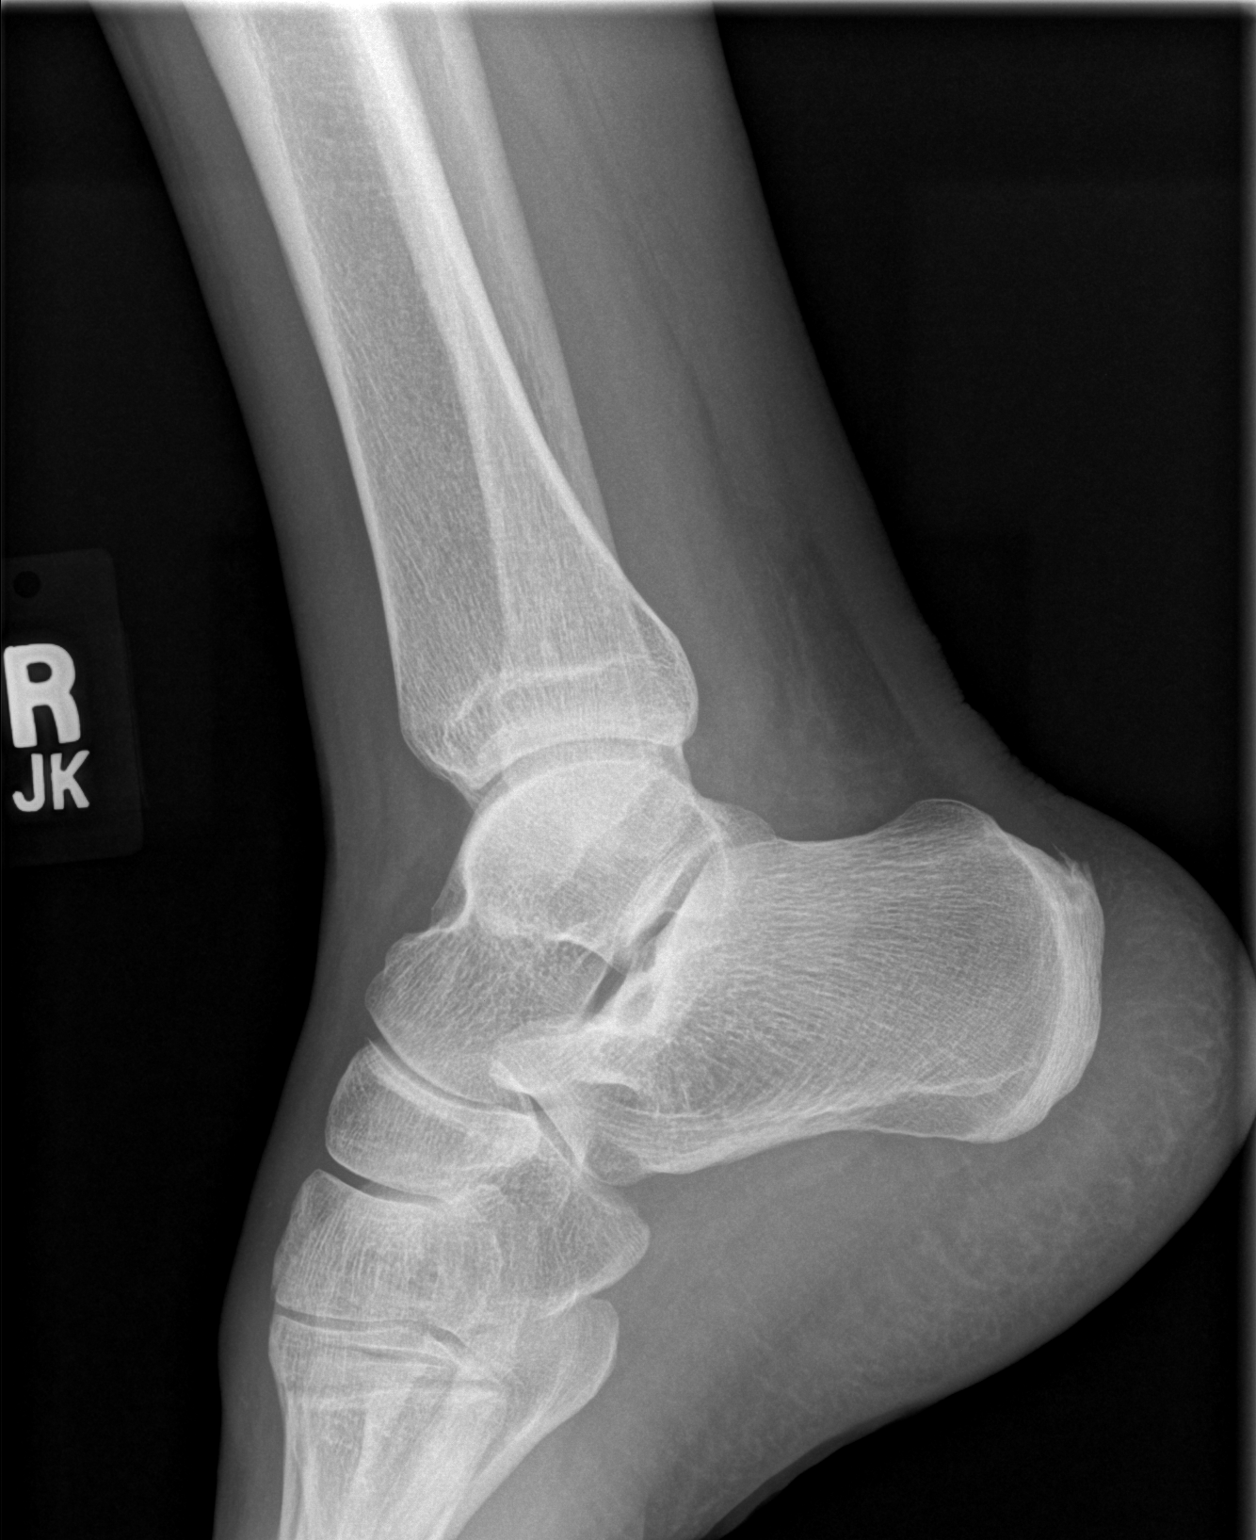

[3 of 3 positions shown; findings below may reference images not displayed]

FINDINGS: The bones are subjectively adequately mineralized. The ankle joint
mortise is preserved. The talar dome is intact. There is no acute
malleolar fracture. There is subtle irregularity of the distal
fibular cortical surface and the adjacent talar surface consistent
with degenerative change. The talus and calcaneus appear normal. The
metatarsal bases are unremarkable where visualized. The soft tissues
are unremarkable.
IMPRESSION: There is no acute fracture or dislocation of the right ankle. Mild
degenerative change of the fibulotalar articulation is observed.

## 2017-09-30 ENCOUNTER — Other Ambulatory Visit: Payer: Self-pay | Admitting: Urgent Care

## 2017-11-30 IMAGING — CR DG ABDOMEN 1V
1 series · 1 of 1 positions shown · non-contrast
Comparison: 04/11/2016

CLINICAL DATA: Nephrolithiasis

EXAM:
ABDOMEN - 1 VIEW

[t abdomen supine]
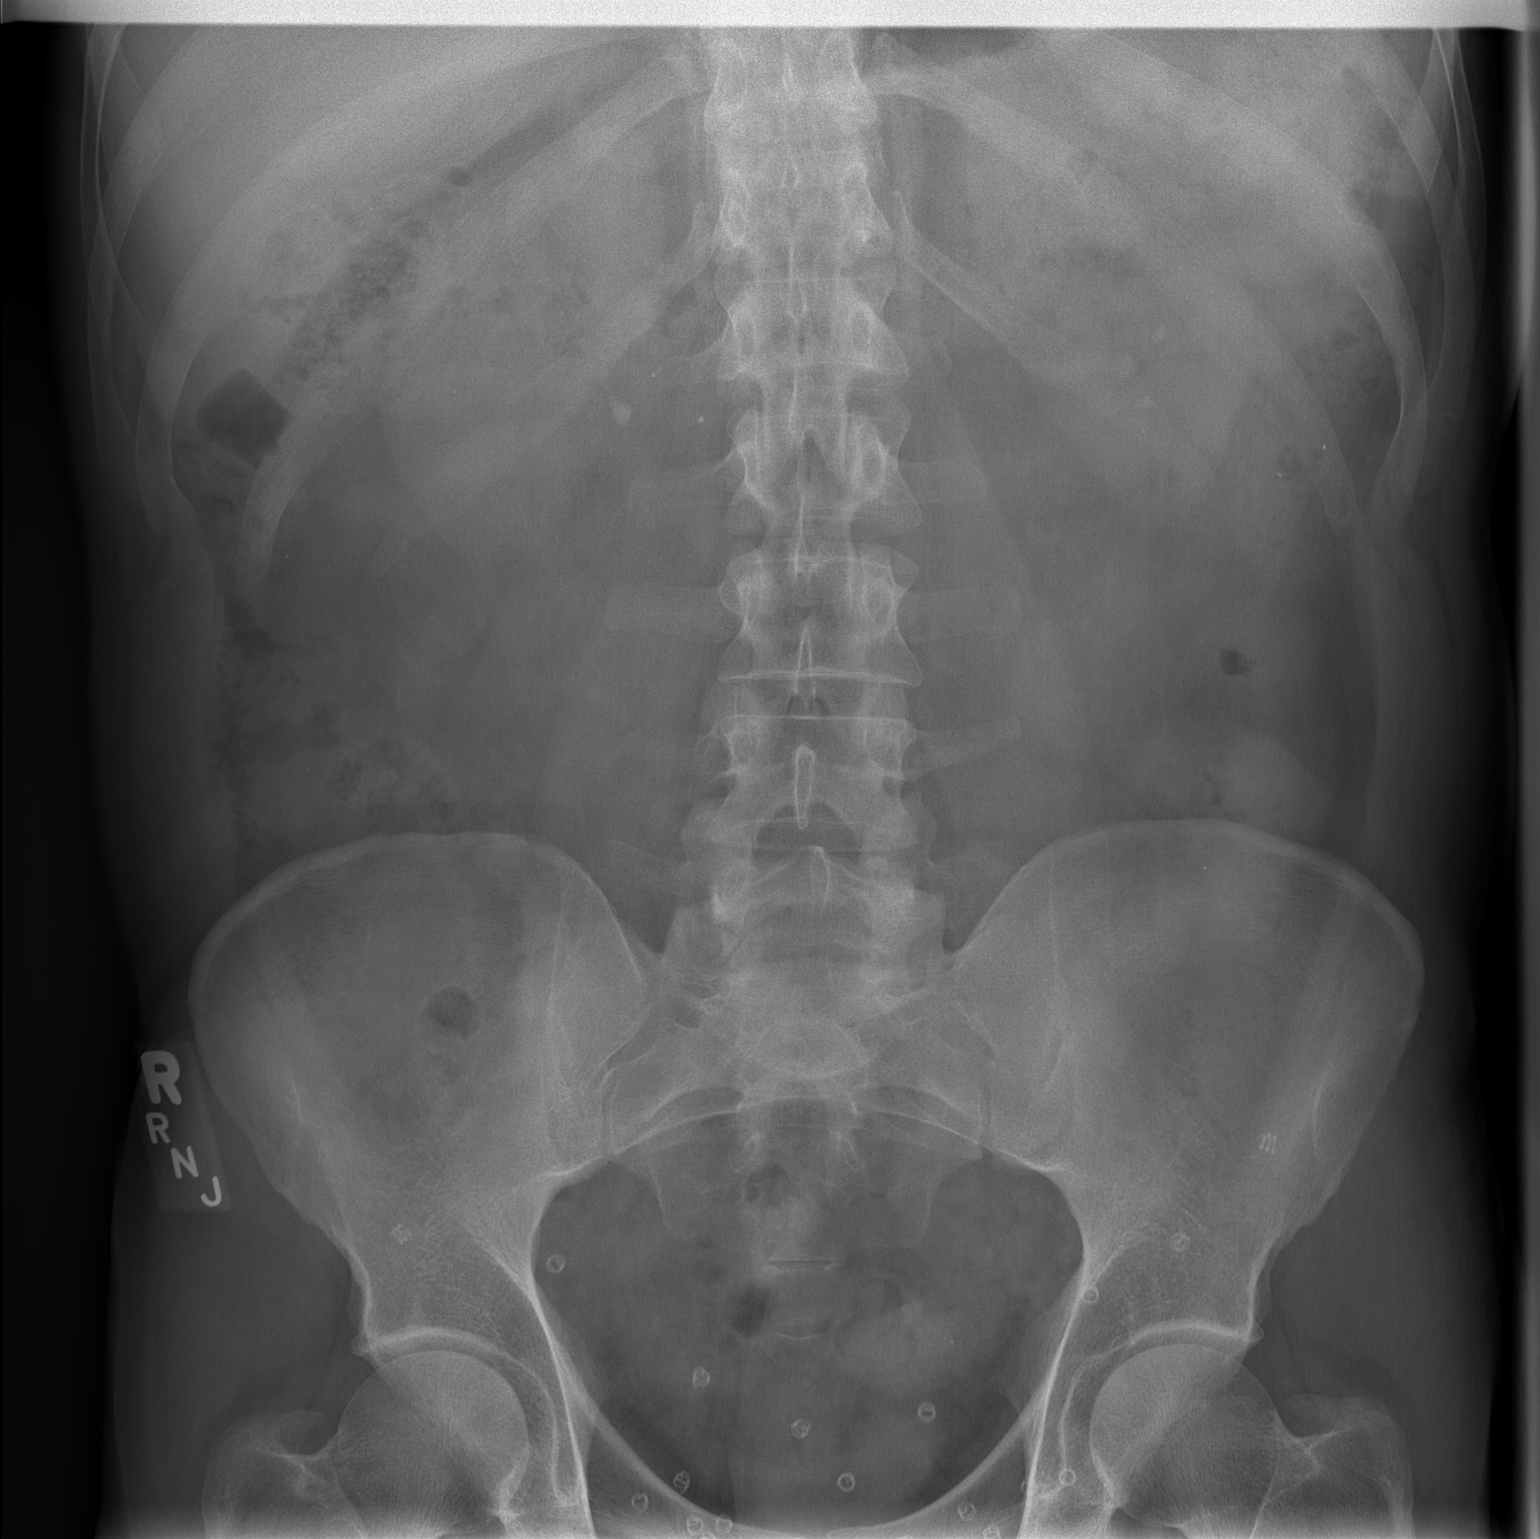

[1 of 1 positions shown; findings below may reference images not displayed]

FINDINGS: Bilateral nephrolithiasis is stable. 7 mm right UPJ calculus is
stable. Postoperative changes from lower abdominal hernia repair. No
disproportionate dilatation of bowel. No obvious free
intraperitoneal gas.
IMPRESSION: Bilateral nephrolithiasis.

Stable right UPJ calculus.

Nonobstructive bowel gas pattern.

## 2018-01-22 ENCOUNTER — Other Ambulatory Visit: Payer: Self-pay | Admitting: Infectious Diseases

## 2018-01-22 ENCOUNTER — Other Ambulatory Visit: Payer: Self-pay | Admitting: *Deleted

## 2018-01-22 DIAGNOSIS — B20 Human immunodeficiency virus [HIV] disease: Secondary | ICD-10-CM

## 2018-01-22 MED ORDER — LAMIVUDINE-ZIDOVUDINE 150-300 MG PO TABS
1.0000 | ORAL_TABLET | Freq: Two times a day (BID) | ORAL | 6 refills | Status: DC
Start: 2018-01-22 — End: 2018-04-21

## 2018-01-22 MED ORDER — EFAVIRENZ 600 MG PO TABS: 600.0000 mg | ORAL_TABLET | Freq: Every day | ORAL | 6 refills | Status: DC

## 2018-03-23 ENCOUNTER — Ambulatory Visit: Payer: PRIVATE HEALTH INSURANCE | Admitting: Infectious Diseases

## 2018-04-01 ENCOUNTER — Ambulatory Visit: Payer: PRIVATE HEALTH INSURANCE | Admitting: Infectious Diseases

## 2018-04-02 ENCOUNTER — Ambulatory Visit: Payer: PRIVATE HEALTH INSURANCE | Admitting: Infectious Diseases

## 2018-04-08 ENCOUNTER — Ambulatory Visit: Payer: PRIVATE HEALTH INSURANCE | Admitting: Infectious Diseases

## 2018-04-21 ENCOUNTER — Ambulatory Visit: Payer: PRIVATE HEALTH INSURANCE | Admitting: Infectious Diseases

## 2018-04-21 ENCOUNTER — Encounter: Payer: Self-pay | Admitting: Infectious Diseases

## 2018-04-21 VITALS — BP 129/79 | HR 82 | Temp 97.8°F | Ht 71.0 in | Wt 174.8 lb

## 2018-04-21 DIAGNOSIS — N200 Calculus of kidney: Secondary | ICD-10-CM | POA: Diagnosis not present

## 2018-04-21 DIAGNOSIS — Z79899 Other long term (current) drug therapy: Secondary | ICD-10-CM | POA: Diagnosis not present

## 2018-04-21 DIAGNOSIS — Z113 Encounter for screening for infections with a predominantly sexual mode of transmission: Secondary | ICD-10-CM

## 2018-04-21 DIAGNOSIS — B20 Human immunodeficiency virus [HIV] disease: Secondary | ICD-10-CM

## 2018-04-21 MED ORDER — LAMIVUDINE-ZIDOVUDINE 150-300 MG PO TABS: 1.0000 | ORAL_TABLET | Freq: Two times a day (BID) | ORAL | 6 refills | Status: DC

## 2018-04-21 MED ORDER — EFAVIRENZ 600 MG PO TABS: 600.0000 mg | ORAL_TABLET | Freq: Every day | ORAL | 6 refills | Status: DC

## 2018-04-21 NOTE — Assessment & Plan Note (Signed)
He is doing well Appreciate uro f/u.

## 2018-04-21 NOTE — Assessment & Plan Note (Signed)
He is doing well Spoke about updating ART- would be interested in the upcoming injectable (once a month) Offered/refused condoms.  Gets flu vax at work Has gotten PCV Will see him back in 9 months

## 2018-04-21 NOTE — Progress Notes (Signed)
   Subjective:    Patient ID: Brendan Price, male    DOB: 12-16-1970, 47 y.o.   MRN: [id number]  HPI 47yo M diagnosed with HIV in 2002. He remains on his initial ART of CBV/EFV He has also been having problems with kidney stones.  Off tamsulosin now, on indapamide now from uro.  Has been having issues with his plantar warts on his feet. Has new rx for this from derm. Compound W.   HIV 1 RNA Quant (copies/mL)  Date Value  06/23/2017 <20 DETECTED (A)  03/06/2016 144 (H)  03/15/2015 <20   CD4 T Cell Abs (/uL)  Date Value  06/23/2017 770  03/06/2016 840  03/15/2015 740    Review of Systems  Constitutional: Negative for appetite change and unexpected weight change.  Respiratory: Negative for shortness of breath.   Gastrointestinal: Negative for constipation and diarrhea.  Genitourinary: Negative for difficulty urinating, frequency and hematuria.  Psychiatric/Behavioral: Negative for dysphoric mood.  Please see HPI. All other systems reviewed and negative.      Objective:   Physical Exam  Constitutional: He appears well-developed and well-nourished.  Cardiovascular: Normal rate, regular rhythm and normal heart sounds.  Pulmonary/Chest: Effort normal and breath sounds normal.  Abdominal: Soft. Bowel sounds are normal. He exhibits no distension. There is no tenderness.  Skin:     Psychiatric: He has a normal mood and affect.          Assessment & Plan:

## 2018-04-22 LAB — T-HELPER CELL (CD4) - (RCID CLINIC ONLY)
CD4 % Helper T Cell: 40 % (ref 33–55)
CD4 T Cell Abs: 1200 /uL (ref 400–2700)

## 2018-04-23 LAB — HIV-1 RNA QUANT-NO REFLEX-BLD
HIV 1 RNA QUANT: DETECTED {copies}/mL — AB
HIV-1 RNA Quant, Log: <1.3 Log copies/mL — AB

## 2018-04-23 LAB — COMPREHENSIVE METABOLIC PANEL
AG Ratio: 1.5 (calc) (ref 1.0–2.5)
ALKALINE PHOSPHATASE (APISO): 69 U/L (ref 40–115)
ALT: 124 U/L — AB (ref 9–46)
AST: 83 U/L — ABNORMAL HIGH (ref 10–40)
Albumin: 4.4 g/dL (ref 3.6–5.1)
BILIRUBIN TOTAL: 0.8 mg/dL (ref 0.2–1.2)
BUN: 16 mg/dL (ref 7–25)
CALCIUM: 10 mg/dL (ref 8.6–10.3)
CO2: 27 mmol/L (ref 20–32)
Chloride: 103 mmol/L (ref 98–110)
Creat: 0.81 mg/dL (ref 0.60–1.35)
Globulin: 3 g/dL (calc) (ref 1.9–3.7)
Glucose, Bld: 98 mg/dL (ref 65–99)
Potassium: 3.4 mmol/L — ABNORMAL LOW (ref 3.5–5.3)
Sodium: 141 mmol/L (ref 135–146)
TOTAL PROTEIN: 7.4 g/dL (ref 6.1–8.1)

## 2018-04-23 LAB — CBC
HEMATOCRIT: 37.5 % — AB (ref 38.5–50.0)
HEMOGLOBIN: 13.5 g/dL (ref 13.2–17.1)
MCH: 45 pg — AB (ref 27.0–33.0)
MCHC: 36 g/dL (ref 32.0–36.0)
MCV: 125 fL — AB (ref 80.0–100.0)
MPV: 9.9 fL (ref 7.5–12.5)
PLATELETS: 220 10*3/uL (ref 140–400)
RBC: 3 10*6/uL — AB (ref 4.20–5.80)
RDW: 12.7 % (ref 11.0–15.0)
WBC: 7.4 10*3/uL (ref 3.8–10.8)

## 2018-04-23 LAB — RPR: RPR Ser Ql: NONREACTIVE

## 2018-04-23 LAB — LIPID PANEL
Cholesterol: 173 mg/dL (ref <200)
HDL: 29 mg/dL — AB (ref >40)
Non-HDL Cholesterol (Calc): 144 mg/dL (calc) — ABNORMAL HIGH (ref <130)
TRIGLYCERIDES: 547 mg/dL — AB (ref <150)
Total CHOL/HDL Ratio: 6 (calc) — ABNORMAL HIGH (ref <5.0)

## 2018-06-25 ENCOUNTER — Other Ambulatory Visit: Payer: Self-pay | Admitting: Internal Medicine

## 2018-06-25 DIAGNOSIS — R945 Abnormal results of liver function studies: Principal | ICD-10-CM

## 2018-06-25 DIAGNOSIS — R7989 Other specified abnormal findings of blood chemistry: Secondary | ICD-10-CM

## 2018-06-29 ENCOUNTER — Other Ambulatory Visit: Payer: PRIVATE HEALTH INSURANCE

## 2018-06-30 ENCOUNTER — Other Ambulatory Visit: Payer: PRIVATE HEALTH INSURANCE

## 2018-07-20 ENCOUNTER — Ambulatory Visit
Admission: RE | Admit: 2018-07-20 | Discharge: 2018-07-20 | Disposition: A | Discharge status: Home / Self Care | Payer: PRIVATE HEALTH INSURANCE | Source: Ambulatory Visit | Attending: Internal Medicine | Admitting: Internal Medicine

## 2018-07-20 DIAGNOSIS — R945 Abnormal results of liver function studies: Principal | ICD-10-CM

## 2018-07-20 DIAGNOSIS — R7989 Other specified abnormal findings of blood chemistry: Secondary | ICD-10-CM

## 2018-07-20 DIAGNOSIS — K760 Fatty (change of) liver, not elsewhere classified: Secondary | ICD-10-CM

## 2018-07-20 HISTORY — DX: Fatty (change of) liver, not elsewhere classified: K76.0

## 2019-01-20 ENCOUNTER — Other Ambulatory Visit: Payer: PRIVATE HEALTH INSURANCE

## 2019-02-03 ENCOUNTER — Encounter: Payer: PRIVATE HEALTH INSURANCE | Admitting: Infectious Diseases

## 2019-02-05 ENCOUNTER — Ambulatory Visit: Payer: PRIVATE HEALTH INSURANCE | Admitting: Infectious Diseases

## 2019-03-02 ENCOUNTER — Encounter: Payer: Self-pay | Admitting: Infectious Diseases

## 2019-03-02 ENCOUNTER — Other Ambulatory Visit: Payer: Self-pay

## 2019-03-02 ENCOUNTER — Ambulatory Visit: Payer: PRIVATE HEALTH INSURANCE | Admitting: Infectious Diseases

## 2019-03-02 VITALS — BP 110/76 | HR 89 | Temp 98.2°F | Wt 162.0 lb

## 2019-03-02 DIAGNOSIS — Z79899 Other long term (current) drug therapy: Secondary | ICD-10-CM

## 2019-03-02 DIAGNOSIS — N200 Calculus of kidney: Secondary | ICD-10-CM

## 2019-03-02 DIAGNOSIS — Z23 Encounter for immunization: Secondary | ICD-10-CM | POA: Diagnosis not present

## 2019-03-02 DIAGNOSIS — B20 Human immunodeficiency virus [HIV] disease: Secondary | ICD-10-CM

## 2019-03-02 DIAGNOSIS — Z113 Encounter for screening for infections with a predominantly sexual mode of transmission: Secondary | ICD-10-CM

## 2019-03-02 DIAGNOSIS — E781 Pure hyperglyceridemia: Secondary | ICD-10-CM

## 2019-03-02 DIAGNOSIS — E881 Lipodystrophy, not elsewhere classified: Secondary | ICD-10-CM

## 2019-03-02 NOTE — Assessment & Plan Note (Signed)
Seems to be failry stable.  He will call uro for f/u appt.

## 2019-03-02 NOTE — Addendum Note (Signed)
Addended by: Aundria Rud on: 03/02/2019 03:45 PM   Modules accepted: Orders

## 2019-03-02 NOTE — Assessment & Plan Note (Signed)
He is doing well Possible change to dovato or biktarvy or injectable.  Partner is positive.  Check his HIV and  CD4, lipids.  mening #2 today rtc in 9 months

## 2019-03-02 NOTE — Assessment & Plan Note (Signed)
Hopefully change his ART soon which will likely improve this.

## 2019-03-02 NOTE — Assessment & Plan Note (Signed)
May change meds if combivir runs out.  Will watch, may change his lipo and lipids.

## 2019-03-02 NOTE — Progress Notes (Signed)
   Subjective:    Patient ID: Brendan Price, male    DOB: 24-Apr-1971, 48 y.o.   MRN: [id number]  HPI 48yo M diagnosed with HIV in 2002. He remains on his initial ART of CBV/EFV He has also been having problems with kidney stones.  Off tamsulosin now, on indapamide now from uro. Passed 2 stones in last 24 months.  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 He has had these for several years. He had u/s by PCP which only showed "fatty liver".  He attributes these to his meds.  Has been eating out less. Has lost 12#.  Worried if his inguinal hernia has recurred. He is deferring further eval due to COVID.   HIV 1 RNA Quant (copies/mL)  Date Value  04/21/2018 <20 DETECTED (A)  06/23/2017 <20 DETECTED (A)  03/06/2016 144 (H)   CD4 T Cell Abs (/uL)  Date Value  04/21/2018 1,200  06/23/2017 770  03/06/2016 840    Review of Systems  Constitutional: Negative for appetite change, chills, fever and unexpected weight change.  Respiratory: Negative for cough and shortness of breath.   Gastrointestinal: Negative for blood in stool and constipation.  Genitourinary: Negative for difficulty urinating.       Objective:   Physical Exam Constitutional:      Appearance: Normal appearance.  HENT:     Mouth/Throat:     Mouth: Mucous membranes are moist.     Pharynx: No oropharyngeal exudate.  Eyes:     Extraocular Movements: Extraocular movements intact.     Pupils: Pupils are equal, round, and reactive to light.  Neck:     Musculoskeletal: Normal range of motion and neck supple.  Cardiovascular:     Rate and Rhythm: Normal rate and regular rhythm.  Pulmonary:     Effort: Pulmonary effort is normal.     Breath sounds: Normal breath sounds.  Abdominal:     General: Bowel sounds are normal. There is no distension.     Palpations: Abdomen is soft.     Tenderness: There is no abdominal tenderness.  Musculoskeletal:     Right lower  leg: No edema.     Left lower leg: No edema.  Neurological:     General: No focal deficit present.     Mental Status: He is alert.  Psychiatric:        Mood and Affect: Mood normal.           Assessment & Plan:

## 2019-03-03 LAB — T-HELPER CELL (CD4) - (RCID CLINIC ONLY)
CD4 % Helper T Cell: 43 % (ref 33–65)
CD4 T Cell Abs: 1091 /uL (ref 400–1790)

## 2019-03-06 LAB — LIPID PANEL
Cholesterol: 226 mg/dL — ABNORMAL HIGH (ref <200)
HDL: 26 mg/dL — ABNORMAL LOW (ref >=40)
Non-HDL Cholesterol (Calc): 200 mg/dL (calc) — ABNORMAL HIGH (ref <130)
Total CHOL/HDL Ratio: 8.7 (calc) — ABNORMAL HIGH (ref <5.0)
Triglycerides: 1782 mg/dL — ABNORMAL HIGH (ref <150)

## 2019-03-06 LAB — HIV-1 RNA QUANT-NO REFLEX-BLD
HIV 1 RNA Quant: 24 copies/mL — ABNORMAL HIGH
HIV-1 RNA Quant, Log: 1.38 Log copies/mL — ABNORMAL HIGH

## 2019-04-21 ENCOUNTER — Other Ambulatory Visit: Payer: Self-pay | Admitting: Infectious Diseases

## 2019-04-21 DIAGNOSIS — B20 Human immunodeficiency virus [HIV] disease: Secondary | ICD-10-CM

## 2019-04-21 NOTE — Telephone Encounter (Signed)
Received  Interface from [facility] pharmacy requesting refill for Combivir and Sustiva.  Per last office visit note provider may change medication. Routing to provider for advise.

## 2019-04-23 ENCOUNTER — Telehealth: Payer: Self-pay | Admitting: *Deleted

## 2019-04-23 MED ORDER — LAMIVUDINE-ZIDOVUDINE 150-300 MG PO TABS: 1.0000 | ORAL_TABLET | Freq: Two times a day (BID) | ORAL | 6 refills | Status: DC

## 2019-04-23 NOTE — Telephone Encounter (Signed)
Per conversation with Dr Johnnye Sima the patient will remain on his current medication until the cabotegravir-rilpivirine  Is available. Refilled the patient sustiva and combivir.

## 2019-07-07 ENCOUNTER — Telehealth: Payer: Self-pay | Admitting: *Deleted

## 2019-07-07 NOTE — Telephone Encounter (Signed)
Dr Laurann Montana is calling to speak with Dr Johnnye Sima regarding mutual patient.  Call back number is [phone].  Landis Gandy, RN

## 2019-07-19 IMAGING — US US ABDOMEN LIMITED
1 series · 14 of 25 positions shown · non-contrast
Comparison: None.

CLINICAL DATA: Elevated LFTs

EXAM:
ULTRASOUND ABDOMEN LIMITED RIGHT UPPER QUADRANT

[Series 1: us abdomen limited · 0.22mm/px · 14 of 45 slices shown]
[im 1/45]
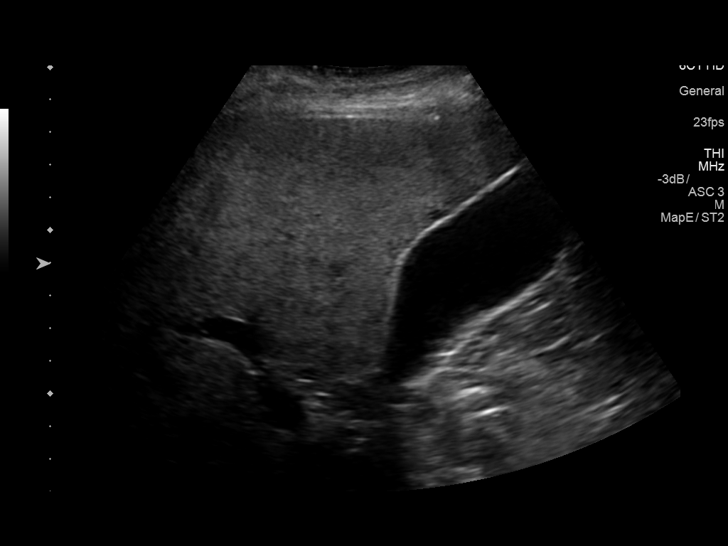
[im 4/45]
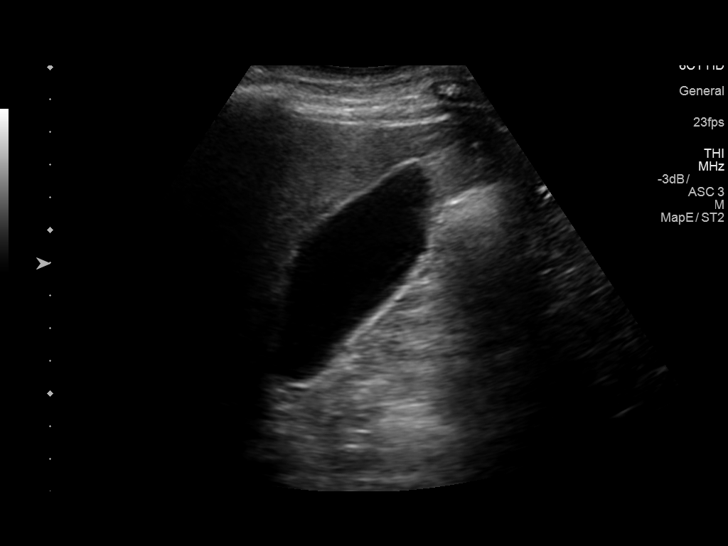
[im 8/45]
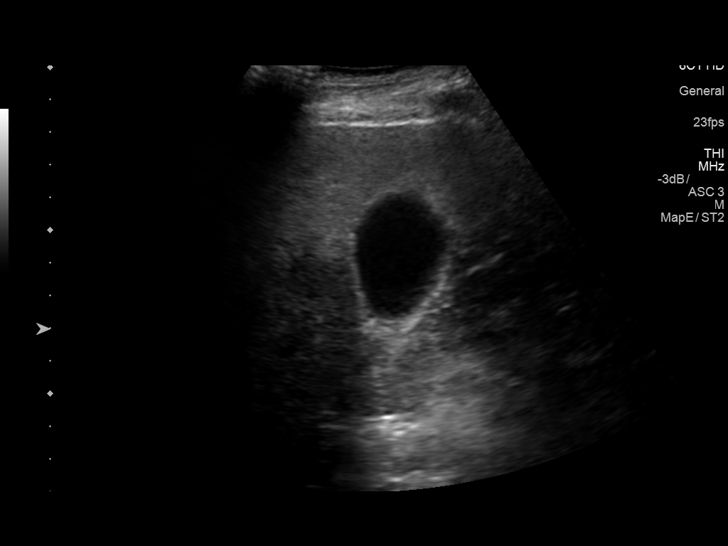
[im 12/45]
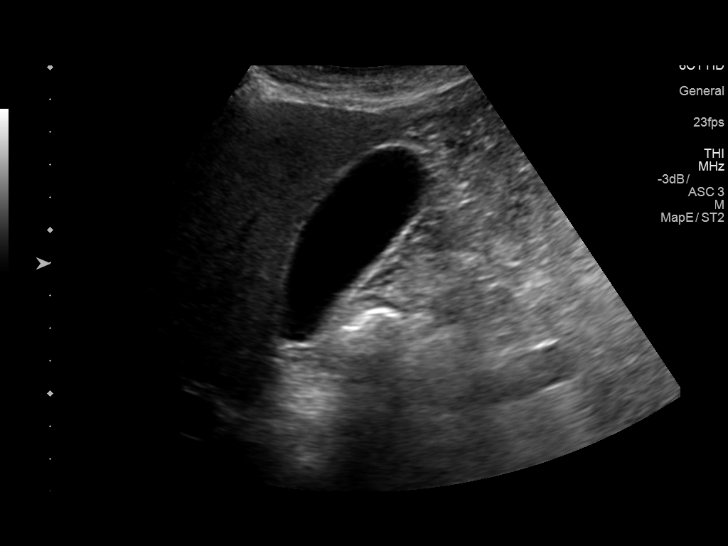
[im 15/45]
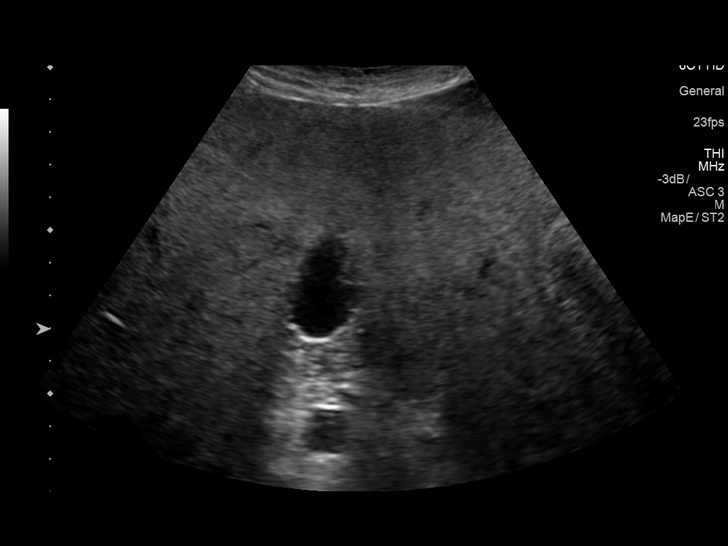
[im 17/45]
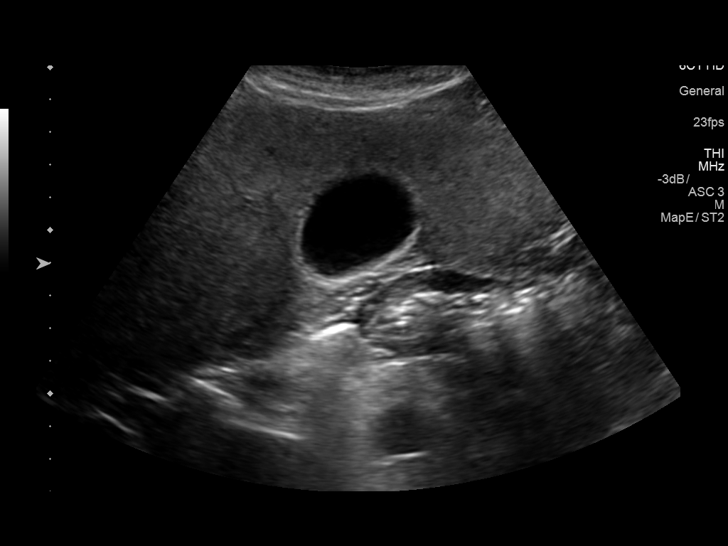
[im 21/45]
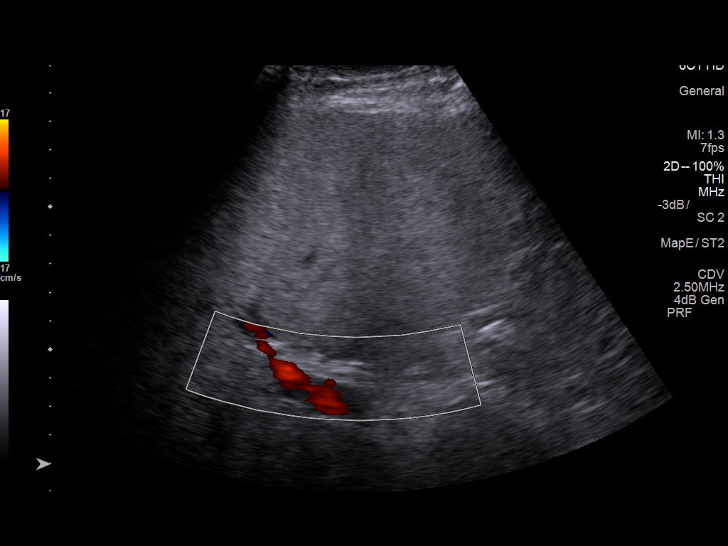
[im 24/45]
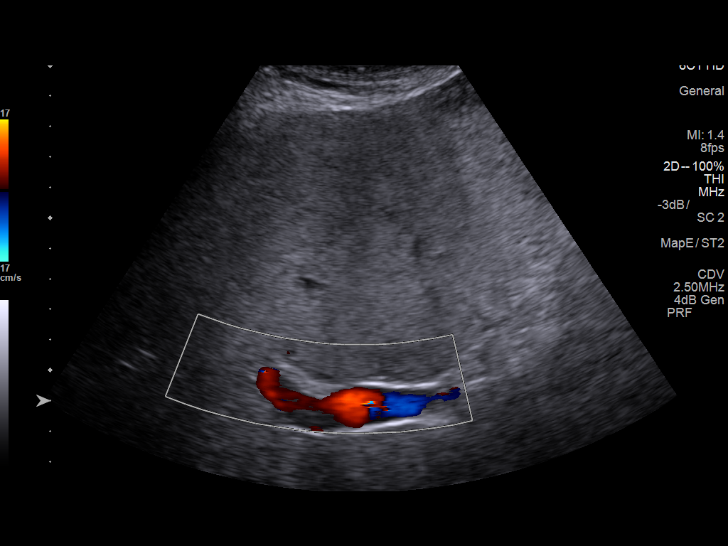
[im 28/45]
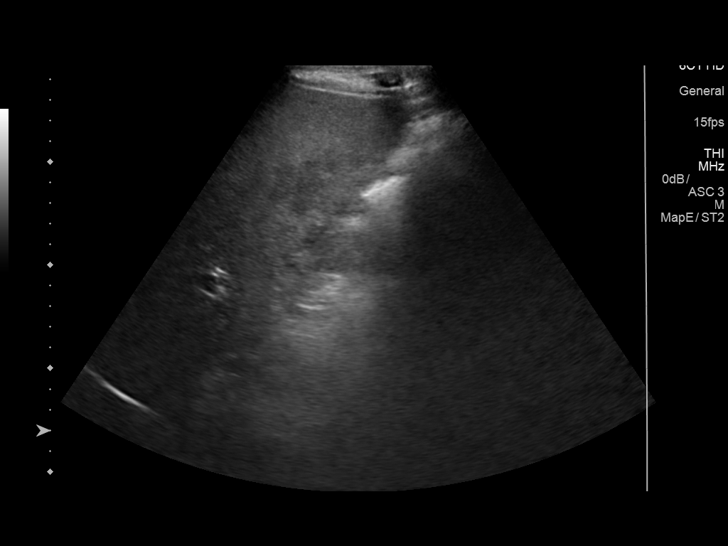
[im 30/45]
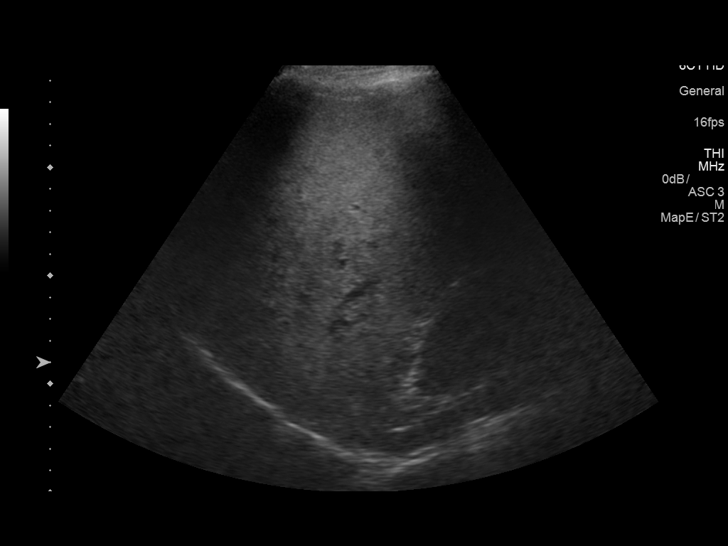
[im 34/45]
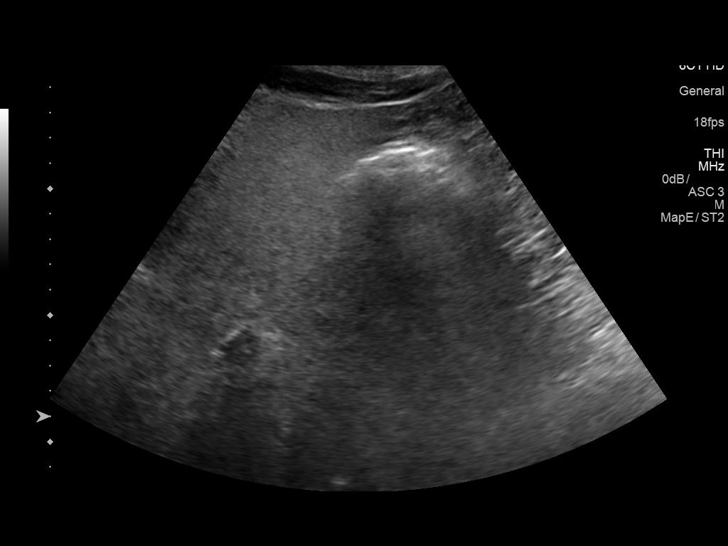
[im 37/45]
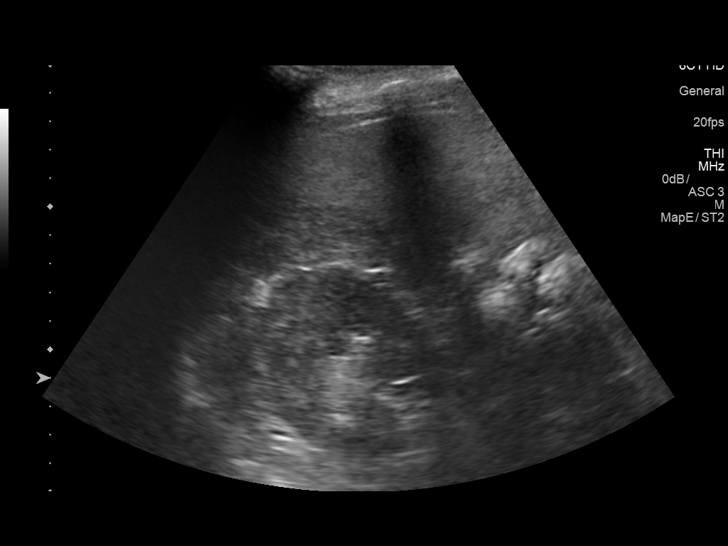
[im 41/45]
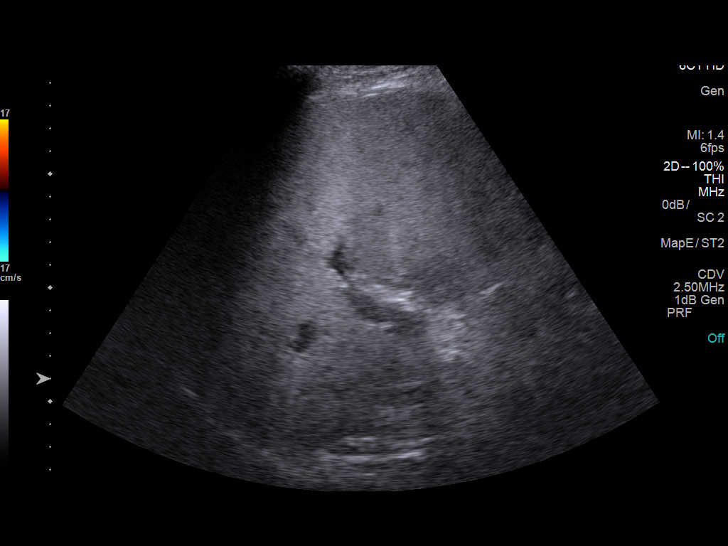
[im 45/45]
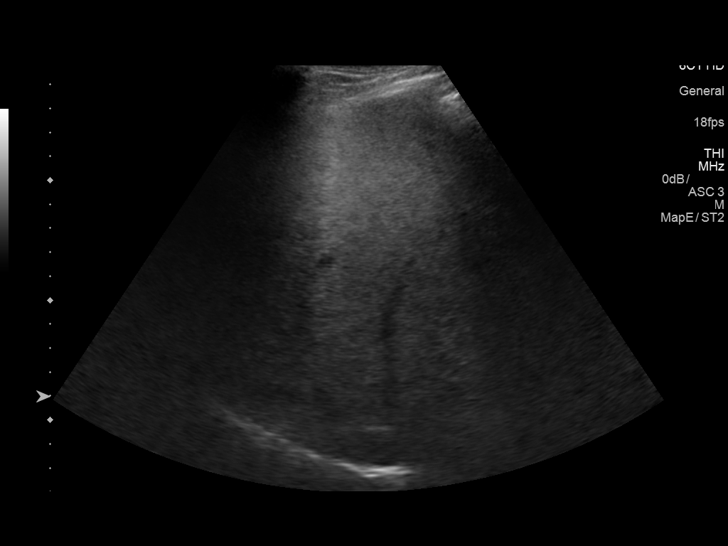

[14 of 25 positions shown; findings below may reference images not displayed]

FINDINGS: Gallbladder:

No gallstones or wall thickening visualized. No sonographic Murphy
sign noted by sonographer.

Common bile duct:

Diameter: 2.6 mm

Liver:

Diffuse increased echogenicity and heterogeneity is noted. This is
most consistent with fatty infiltration. Portal vein is patent on
color Doppler imaging with normal direction of blood flow towards
the liver.
IMPRESSION: Fatty liver.

## 2019-12-07 ENCOUNTER — Other Ambulatory Visit: Payer: Self-pay

## 2019-12-07 ENCOUNTER — Encounter: Payer: Self-pay | Admitting: Infectious Diseases

## 2019-12-07 ENCOUNTER — Ambulatory Visit: Payer: PRIVATE HEALTH INSURANCE | Admitting: Infectious Diseases

## 2019-12-07 VITALS — BP 136/95 | HR 68 | Temp 97.9°F | Ht 71.0 in | Wt 175.0 lb

## 2019-12-07 DIAGNOSIS — E781 Pure hyperglyceridemia: Secondary | ICD-10-CM

## 2019-12-07 DIAGNOSIS — B20 Human immunodeficiency virus [HIV] disease: Secondary | ICD-10-CM | POA: Diagnosis not present

## 2019-12-07 DIAGNOSIS — E1365 Other specified diabetes mellitus with hyperglycemia: Secondary | ICD-10-CM

## 2019-12-07 DIAGNOSIS — N200 Calculus of kidney: Secondary | ICD-10-CM | POA: Diagnosis not present

## 2019-12-07 DIAGNOSIS — Z113 Encounter for screening for infections with a predominantly sexual mode of transmission: Secondary | ICD-10-CM

## 2019-12-07 DIAGNOSIS — E0865 Diabetes mellitus due to underlying condition with hyperglycemia: Secondary | ICD-10-CM | POA: Insufficient documentation

## 2019-12-07 DIAGNOSIS — IMO0002 Reserved for concepts with insufficient information to code with codable children: Secondary | ICD-10-CM

## 2019-12-07 DIAGNOSIS — Z79899 Other long term (current) drug therapy: Secondary | ICD-10-CM

## 2019-12-07 DIAGNOSIS — E138 Other specified diabetes mellitus with unspecified complications: Secondary | ICD-10-CM

## 2019-12-07 MED ORDER — EFAVIRENZ 600 MG PO TABS: 600.0000 mg | ORAL_TABLET | Freq: Every day | ORAL | 6 refills | Status: DC

## 2019-12-07 MED ORDER — LAMIVUDINE-ZIDOVUDINE 150-300 MG PO TABS: 1.0000 | ORAL_TABLET | Freq: Two times a day (BID) | ORAL | 6 refills | Status: DC

## 2019-12-07 NOTE — Assessment & Plan Note (Signed)
Will refill his meds (PLEASE KEEP IN ORIGINAL BOTTLE) 90 days U=U with partner Shingles at 65 Has had COVID.  rtc in 9 months.

## 2019-12-07 NOTE — Assessment & Plan Note (Signed)
Will continue on fenofibrate.  Checks lipids today.

## 2019-12-07 NOTE — Progress Notes (Signed)
   Subjective:    Patient ID: Brendan Price, male    DOB: Sep 21, 1970, 49 y.o.   MRN: [id number]  HPI 49yo M diagnosed with HIV in 2002. He remains on his initial ART of CBV/EFV He has also been having problems with kidney stones.  On indapamide now from uro. Passed 1 stone in last week. Had pain for only 2 days prior to passing.  Prev had issues with his plantar warts on his feet. Now has calluses.   Has been dx as DM since his last visit (07-2019)- now on metformin. Initial A1C 9.8% (07-05-19). Last was 5.4% (10-2018).  Trig 304 (10-26-2019)(prev 1489 11-16-220) Has been seeing dietician, using eliptical. Has questions about max HR.  Has also started on fenofibrate. Trig are now much better. Has increased his ASA dose as well due to hx of CVA and MI in family. (162mg  bid), also on pepcid, omeprazole.   With coughing, deep breathing can feel his inguinal hernia move.   Has been with current partner for 6 yrs.   Has gotten COVID vax.   HIV 1 RNA Quant (copies/mL)  Date Value  03/02/2019 24 (H)  04/21/2018 <20 DETECTED (A)  06/23/2017 <20 DETECTED (A)   CD4 T Cell Abs (/uL)  Date Value  03/02/2019 1,091  04/21/2018 1,200  06/23/2017 770     Review of Systems  Constitutional: Negative for appetite change and unexpected weight change.  Gastrointestinal: Negative for constipation and diarrhea.  Genitourinary: Negative for difficulty urinating.  Skin: Negative for wound.  Please see HPI. All other systems reviewed and negative.      Objective:   Physical Exam Constitutional:      Appearance: Normal appearance. He is normal weight.  HENT:     Mouth/Throat:     Mouth: Mucous membranes are moist.     Pharynx: Oropharynx is clear. No oropharyngeal exudate.  Eyes:     Extraocular Movements: Extraocular movements intact.     Pupils: Pupils are equal, round, and reactive to light.  Cardiovascular:     Rate and Rhythm: Normal rate and regular rhythm.  Pulmonary:     Effort:  Pulmonary effort is normal.     Breath sounds: Normal breath sounds.  Abdominal:     General: Bowel sounds are normal. There is no distension.     Palpations: Abdomen is soft.     Tenderness: There is no abdominal tenderness.  Musculoskeletal:     Cervical back: Normal range of motion and neck supple.     Right lower leg: No edema.     Left lower leg: No edema.  Neurological:     Mental Status: He is alert.  Psychiatric:        Mood and Affect: Mood normal.           Assessment & Plan:

## 2019-12-07 NOTE — Assessment & Plan Note (Signed)
Complications of lipids, nepholithiasis.  Appreciate Dr Delene Ruffini excellent care.  Will check his A1C today.

## 2019-12-07 NOTE — Assessment & Plan Note (Signed)
Appreciate f/u with Uro.

## 2019-12-08 LAB — URINALYSIS, ROUTINE W REFLEX MICROSCOPIC
Bilirubin Urine: NEGATIVE
Glucose, UA: NEGATIVE
Hgb urine dipstick: NEGATIVE
Ketones, ur: NEGATIVE
Leukocytes,Ua: NEGATIVE
Nitrite: NEGATIVE
Protein, ur: NEGATIVE
Specific Gravity, Urine: 1.017 (ref 1.001–1.03)
pH: 6.5 (ref 5.0–8.0)

## 2019-12-08 LAB — MICROALBUMIN / CREATININE URINE RATIO
Creatinine, Urine: 88 mg/dL (ref 20–320)
Microalb Creat Ratio: 6 mcg/mg creat (ref <30)
Microalb, Ur: 0.5 mg/dL

## 2019-12-08 LAB — T-HELPER CELL (CD4) - (RCID CLINIC ONLY)
CD4 % Helper T Cell: 41 % (ref 33–65)
CD4 T Cell Abs: 1004 /uL (ref 400–1790)

## 2019-12-12 LAB — CBC
HCT: 40.7 % (ref 38.5–50.0)
Hemoglobin: 15 g/dL (ref 13.2–17.1)
MCH: 45 pg — ABNORMAL HIGH (ref 27.0–33.0)
MCHC: 36.9 g/dL — ABNORMAL HIGH (ref 32.0–36.0)
MCV: 122.2 fL — ABNORMAL HIGH (ref 80.0–100.0)
MPV: 10.2 fL (ref 7.5–12.5)
Platelets: 285 10*3/uL (ref 140–400)
RBC: 3.33 10*6/uL — ABNORMAL LOW (ref 4.20–5.80)
RDW: 12.7 % (ref 11.0–15.0)
WBC: 5 10*3/uL (ref 3.8–10.8)

## 2019-12-12 LAB — COMPREHENSIVE METABOLIC PANEL
AG Ratio: 1.7 (calc) (ref 1.0–2.5)
ALT: 108 U/L — ABNORMAL HIGH (ref 9–46)
AST: 76 U/L — ABNORMAL HIGH (ref 10–40)
Albumin: 4.8 g/dL (ref 3.6–5.1)
Alkaline phosphatase (APISO): 48 U/L (ref 36–130)
BUN: 17 mg/dL (ref 7–25)
CO2: 31 mmol/L (ref 20–32)
Calcium: 10.6 mg/dL — ABNORMAL HIGH (ref 8.6–10.3)
Chloride: 101 mmol/L (ref 98–110)
Creat: 0.87 mg/dL (ref 0.60–1.35)
Globulin: 2.9 g/dL (calc) (ref 1.9–3.7)
Glucose, Bld: 104 mg/dL — ABNORMAL HIGH (ref 65–99)
Potassium: 3.7 mmol/L (ref 3.5–5.3)
Sodium: 140 mmol/L (ref 135–146)
Total Bilirubin: 0.8 mg/dL (ref 0.2–1.2)
Total Protein: 7.7 g/dL (ref 6.1–8.1)

## 2019-12-12 LAB — HIV-1 RNA ULTRAQUANT REFLEX TO GENTYP+
HIV 1 RNA Quant: 25 copies/mL — ABNORMAL HIGH
HIV-1 RNA Quant, Log: 1.4 Log copies/mL — ABNORMAL HIGH

## 2019-12-12 LAB — LIPID PANEL
Cholesterol: 159 mg/dL (ref <200)
HDL: 27 mg/dL — ABNORMAL LOW (ref >=40)
Non-HDL Cholesterol (Calc): 132 mg/dL (calc) — ABNORMAL HIGH (ref <130)
Total CHOL/HDL Ratio: 5.9 (calc) — ABNORMAL HIGH (ref <5.0)
Triglycerides: 401 mg/dL — ABNORMAL HIGH (ref <150)

## 2019-12-12 LAB — HEMOGLOBIN A1C
Hgb A1c MFr Bld: 5.2 % of total Hgb (ref <5.7)
Mean Plasma Glucose: 103 (calc)
eAG (mmol/L): 5.7 (calc)

## 2019-12-12 LAB — RPR: RPR Ser Ql: NONREACTIVE

## 2020-05-02 DIAGNOSIS — C801 Malignant (primary) neoplasm, unspecified: Secondary | ICD-10-CM | POA: Insufficient documentation

## 2020-05-02 HISTORY — DX: Malignant (primary) neoplasm, unspecified: C80.1

## 2020-09-07 ENCOUNTER — Ambulatory Visit: Payer: PRIVATE HEALTH INSURANCE | Admitting: Infectious Diseases

## 2020-10-31 ENCOUNTER — Encounter: Payer: Self-pay | Admitting: Infectious Diseases

## 2020-10-31 ENCOUNTER — Ambulatory Visit (INDEPENDENT_AMBULATORY_CARE_PROVIDER_SITE_OTHER): Payer: PRIVATE HEALTH INSURANCE | Admitting: Infectious Diseases

## 2020-10-31 ENCOUNTER — Other Ambulatory Visit: Payer: Self-pay

## 2020-10-31 VITALS — BP 133/92 | HR 77 | Temp 98.1°F | Ht 71.0 in | Wt 177.0 lb

## 2020-10-31 DIAGNOSIS — Z113 Encounter for screening for infections with a predominantly sexual mode of transmission: Secondary | ICD-10-CM

## 2020-10-31 DIAGNOSIS — N200 Calculus of kidney: Secondary | ICD-10-CM | POA: Diagnosis not present

## 2020-10-31 DIAGNOSIS — K219 Gastro-esophageal reflux disease without esophagitis: Secondary | ICD-10-CM | POA: Insufficient documentation

## 2020-10-31 DIAGNOSIS — E781 Pure hyperglyceridemia: Secondary | ICD-10-CM | POA: Diagnosis not present

## 2020-10-31 DIAGNOSIS — Z79899 Other long term (current) drug therapy: Secondary | ICD-10-CM

## 2020-10-31 DIAGNOSIS — B20 Human immunodeficiency virus [HIV] disease: Secondary | ICD-10-CM

## 2020-10-31 DIAGNOSIS — E1365 Other specified diabetes mellitus with hyperglycemia: Secondary | ICD-10-CM

## 2020-10-31 DIAGNOSIS — IMO0002 Reserved for concepts with insufficient information to code with codable children: Secondary | ICD-10-CM

## 2020-10-31 DIAGNOSIS — E138 Other specified diabetes mellitus with unspecified complications: Secondary | ICD-10-CM

## 2020-10-31 MED ORDER — PANTOPRAZOLE SODIUM 40 MG PO TBEC: 40.0000 mg | DELAYED_RELEASE_TABLET | Freq: Every day | ORAL | 1 refills | Status: DC

## 2020-10-31 NOTE — Assessment & Plan Note (Signed)
Continues on fibrate.  Will repeat labs at f/u.

## 2020-10-31 NOTE — Assessment & Plan Note (Signed)
Will chcek his labs today vax are uptodate Discussed when to get booster, he will make appt to come back on Friday for this.  He will discuss with Dr Laurann Montana about getting colon.  Appears to be doing well. We discussed change to dovato or biktarvy. Cabaneuva.  rtc in 9 months.

## 2020-10-31 NOTE — Assessment & Plan Note (Signed)
Wants to change omeprazole to pantoprazole.

## 2020-10-31 NOTE — Assessment & Plan Note (Signed)
Appreciate Dr Delene Ruffini f/u.  He is doing well

## 2020-10-31 NOTE — Assessment & Plan Note (Signed)
Appreciate uro f/u. Continues to pass stones.

## 2020-10-31 NOTE — Progress Notes (Signed)
   Subjective:    Patient ID: Brendan Price, male  DOB: [birth year], 50 y.o.        MRN: [id number]   HPI 50yo M diagnosed with HIV in 2002. He remains on his initial ART of CBV/EFV Prev had nephrolithiasis.   Has been dx as DM (07-2019)- on metformin. Initial A1C 9.8% (07-05-19). Last was 5.4% (11-2019).  Gets FSG "a few times a week". Using dexcom a few times a year.   Has also started on fenofibrate. Trig are now much better. Has increased his ASA dose as well due to hx of CVA and MI in family. (162mg  bid), also on pepcid, omeprazole.   Inguinal hernia has not been repaired yet. Can feel "popping" when he breaths deep. No pain. Has not seen surgery yet.   Has been with current partner for 7 yrs. Has been to [place] traveling recently, otherwise been staying home. Has questions about 4th COVID vax.     HIV 1 RNA Quant (copies/mL)  Date Value  12/07/2019 25 (H)  03/02/2019 24 (H)  04/21/2018 <20 DETECTED (A)   CD4 T Cell Abs (/uL)  Date Value  12/07/2019 1,004  03/02/2019 1,091  04/21/2018 1,200     Health Maintenance  Topic Date Due  . FOOT EXAM  Never done  . OPHTHALMOLOGY EXAM  Never done  . COLONOSCOPY (Pts 45-88yrs Insurance coverage will need to be confirmed)  Never done  . COVID-19 Vaccine (3 - Pfizer risk 4-dose series) 09/28/2019  . HEMOGLOBIN A1C  06/07/2020  . URINE MICROALBUMIN  12/06/2020  . TETANUS/TDAP  10/27/2026  . INFLUENZA VACCINE  Completed  . PNEUMOCOCCAL POLYSACCHARIDE VACCINE AGE 77-64 HIGH RISK  Completed  . Hepatitis C Screening  Completed  . HIV Screening  Completed  . HPV VACCINES  Aged Out      Review of Systems  Constitutional: Negative for chills, fever and weight loss.  Eyes: Negative for blurred vision.  Respiratory: Negative for cough and shortness of breath.   Gastrointestinal: Negative for constipation and diarrhea.  Genitourinary: Positive for dysuria (continues to have kidney stones. ).  Neurological: Negative for sensory  change.    Please see HPI. All other systems reviewed and negative.     Objective:  Physical Exam Constitutional:      Appearance: Normal appearance.  HENT:     Mouth/Throat:     Mouth: Mucous membranes are moist.     Pharynx: No oropharyngeal exudate.  Eyes:     Extraocular Movements: Extraocular movements intact.     Pupils: Pupils are equal, round, and reactive to light.  Cardiovascular:     Rate and Rhythm: Normal rate and regular rhythm.  Pulmonary:     Effort: Pulmonary effort is normal.     Breath sounds: Normal breath sounds.  Abdominal:     General: Bowel sounds are normal. There is no distension.     Palpations: Abdomen is soft.     Tenderness: There is no abdominal tenderness.  Musculoskeletal:        General: Normal range of motion.     Cervical back: Normal range of motion and neck supple.     Right lower leg: No edema.     Left lower leg: No edema.  Neurological:     Mental Status: He is alert.  Psychiatric:        Mood and Affect: Mood normal.            Assessment & Plan:

## 2020-11-01 LAB — T-HELPER CELL (CD4) - (RCID CLINIC ONLY)
CD4 % Helper T Cell: 39 % (ref 33–65)
CD4 T Cell Abs: 1351 /uL (ref 400–1790)

## 2020-11-02 LAB — COMPREHENSIVE METABOLIC PANEL
AG Ratio: 1.9 (calc) (ref 1.0–2.5)
ALT: 86 U/L — ABNORMAL HIGH (ref 9–46)
AST: 60 U/L — ABNORMAL HIGH (ref 10–35)
Albumin: 4.9 g/dL (ref 3.6–5.1)
Alkaline phosphatase (APISO): 43 U/L (ref 35–144)
BUN: 13 mg/dL (ref 7–25)
CO2: 28 mmol/L (ref 20–32)
Calcium: 10.7 mg/dL — ABNORMAL HIGH (ref 8.6–10.3)
Chloride: 102 mmol/L (ref 98–110)
Creat: 1.02 mg/dL (ref 0.70–1.33)
Globulin: 2.6 g/dL (calc) (ref 1.9–3.7)
Glucose, Bld: 101 mg/dL — ABNORMAL HIGH (ref 65–99)
Potassium: 3.9 mmol/L (ref 3.5–5.3)
Sodium: 142 mmol/L (ref 135–146)
Total Bilirubin: 0.7 mg/dL (ref 0.2–1.2)
Total Protein: 7.5 g/dL (ref 6.1–8.1)

## 2020-11-02 LAB — CBC
HCT: 41 % (ref 38.5–50.0)
Hemoglobin: 14.5 g/dL (ref 13.2–17.1)
MCH: 44.5 pg — ABNORMAL HIGH (ref 27.0–33.0)
MCHC: 35.4 g/dL (ref 32.0–36.0)
MCV: 125.8 fL — ABNORMAL HIGH (ref 80.0–100.0)
MPV: 9.7 fL (ref 7.5–12.5)
Platelets: 291 10*3/uL (ref 140–400)
RBC: 3.26 10*6/uL — ABNORMAL LOW (ref 4.20–5.80)
RDW: 12.1 % (ref 11.0–15.0)
WBC: 5.9 10*3/uL (ref 3.8–10.8)

## 2020-11-02 LAB — LIPID PANEL
Cholesterol: 161 mg/dL (ref <200)
HDL: 31 mg/dL — ABNORMAL LOW (ref >=40)
LDL Cholesterol (Calc): 81 mg/dL (calc)
Non-HDL Cholesterol (Calc): 130 mg/dL (calc) — ABNORMAL HIGH (ref <130)
Total CHOL/HDL Ratio: 5.2 (calc) — ABNORMAL HIGH (ref <5.0)
Triglycerides: 368 mg/dL — ABNORMAL HIGH (ref <150)

## 2020-11-02 LAB — HIV-1 RNA QUANT-NO REFLEX-BLD
HIV 1 RNA Quant: NOT DETECTED Copies/mL
HIV-1 RNA Quant, Log: NOT DETECTED Log cps/mL

## 2020-11-02 LAB — RPR: RPR Ser Ql: NONREACTIVE

## 2020-11-03 ENCOUNTER — Telehealth: Payer: Self-pay | Admitting: Infectious Diseases

## 2020-11-03 NOTE — Telephone Encounter (Signed)
Spoke with patient regarding [employer]. Informed him that labs look good. No concerns regarding Vl and Cd4. Was able to answered all questions.

## 2020-11-03 NOTE — Telephone Encounter (Signed)
Called pt about his concerns about his labs- He was concerned about his "not detected" HIV. I expressed some concern about this elevated Ca- he attributes this to cheese.   He also has some abn of his CBC- most likely due to [place]

## 2021-01-19 ENCOUNTER — Other Ambulatory Visit: Payer: Self-pay | Admitting: Infectious Diseases

## 2021-01-19 DIAGNOSIS — B20 Human immunodeficiency virus [HIV] disease: Secondary | ICD-10-CM

## 2021-04-11 ENCOUNTER — Other Ambulatory Visit: Payer: Self-pay

## 2021-04-11 DIAGNOSIS — U071 COVID-19: Secondary | ICD-10-CM

## 2021-04-11 HISTORY — DX: COVID-19: U07.1

## 2021-04-12 ENCOUNTER — Ambulatory Visit: Payer: PRIVATE HEALTH INSURANCE

## 2021-04-12 DIAGNOSIS — U071 COVID-19: Secondary | ICD-10-CM

## 2021-04-12 MED ORDER — FAMOTIDINE IN NACL 20-0.9 MG/50ML-% IV SOLN: 20.0000 mg | Freq: Once | INTRAVENOUS | Status: AC | PRN

## 2021-04-12 MED ORDER — BEBTELOVIMAB 175 MG/2 ML IV (EUA)
175.0000 mg | Freq: Once | INTRAMUSCULAR | Status: AC
  Administered 2021-04-12: 175 mg via INTRAVENOUS

## 2021-04-12 MED ORDER — ALBUTEROL SULFATE HFA 108 (90 BASE) MCG/ACT IN AERS: 2.0000 | INHALATION_SPRAY | Freq: Once | RESPIRATORY_TRACT | Status: AC | PRN

## 2021-04-12 MED ORDER — DIPHENHYDRAMINE HCL 50 MG/ML IJ SOLN: 50.0000 mg | Freq: Once | INTRAMUSCULAR | Status: AC | PRN

## 2021-04-12 MED ORDER — METHYLPREDNISOLONE SODIUM SUCC 125 MG IJ SOLR: 125.0000 mg | Freq: Once | INTRAMUSCULAR | Status: AC | PRN

## 2021-04-12 MED ORDER — SODIUM CHLORIDE 0.9 % IV SOLN: INTRAVENOUS | Status: DC | PRN

## 2021-04-12 MED ORDER — EPINEPHRINE 0.3 MG/0.3ML IJ SOAJ: 0.3000 mg | Freq: Once | INTRAMUSCULAR | Status: AC | PRN

## 2021-04-12 NOTE — Progress Notes (Signed)
Diagnosis: COVID  Provider:  Marshell Garfinkel, MD  Procedure: Infusion  IV Type: Peripheral, IV Location: L Forearm  Bebtelovimab, Dose: 175 mg  Infusion Start Time: [id number]  Infusion Stop Time: [id number]  Post Infusion IV Care: Observation period completed  Discharge: Condition: Good, Destination: Home . AVS provided to patient.   Performed by:  Lorette Peterkin, Sherlon Handing, LPN

## 2021-04-12 NOTE — Patient Instructions (Signed)

## 2021-04-19 ENCOUNTER — Other Ambulatory Visit: Payer: Self-pay | Admitting: Infectious Diseases

## 2021-04-19 DIAGNOSIS — B20 Human immunodeficiency virus [HIV] disease: Secondary | ICD-10-CM

## 2021-06-19 ENCOUNTER — Other Ambulatory Visit: Payer: Self-pay | Admitting: Infectious Diseases

## 2021-06-19 DIAGNOSIS — B20 Human immunodeficiency virus [HIV] disease: Secondary | ICD-10-CM

## 2021-08-07 ENCOUNTER — Ambulatory Visit: Payer: PRIVATE HEALTH INSURANCE | Admitting: Infectious Diseases

## 2021-08-09 ENCOUNTER — Ambulatory Visit: Payer: No Typology Code available for payment source | Admitting: Infectious Diseases

## 2021-08-09 ENCOUNTER — Encounter: Payer: Self-pay | Admitting: Infectious Diseases

## 2021-08-09 ENCOUNTER — Other Ambulatory Visit: Payer: Self-pay

## 2021-08-09 VITALS — BP 133/89 | HR 87 | Temp 97.7°F | Wt 173.4 lb

## 2021-08-09 DIAGNOSIS — N200 Calculus of kidney: Secondary | ICD-10-CM | POA: Diagnosis not present

## 2021-08-09 DIAGNOSIS — E0865 Diabetes mellitus due to underlying condition with hyperglycemia: Secondary | ICD-10-CM

## 2021-08-09 DIAGNOSIS — B20 Human immunodeficiency virus [HIV] disease: Secondary | ICD-10-CM

## 2021-08-09 DIAGNOSIS — Z79899 Other long term (current) drug therapy: Secondary | ICD-10-CM

## 2021-08-09 DIAGNOSIS — Z113 Encounter for screening for infections with a predominantly sexual mode of transmission: Secondary | ICD-10-CM | POA: Diagnosis not present

## 2021-08-09 DIAGNOSIS — E881 Lipodystrophy, not elsewhere classified: Secondary | ICD-10-CM

## 2021-08-09 NOTE — Assessment & Plan Note (Signed)
Appreciate his nephrologist's f/u.  Await stent, intervention.

## 2021-08-09 NOTE — Progress Notes (Signed)
Subjective:    Patient ID: Brendan Price, male  DOB: 02/19/1971, 50 y.o.        MRN: [id number]   HPI 50 yo M diagnosed with HIV in 2002.  He remains on his initial ART of CBV/EFV Prev had nephrolithiasis.    Has been dx as DM (07-2019)- on metformin. Initial A1C 9.8% (07-05-19). Last was?% dedicded it has been falsely elevated due to his ART.   Gets FSG "a few times a week". These have been giving false hypoglycemic alerts (compression lows).    Has also started on fenofibrate. Trig are now much better. Has increased his ASA dose as well due to hx of CVA and MI in family. (162mg  bid), also on pepcid, omeprazole.    L Inguinal hernia has not been repaired yet.. Can feel it pop out when he takes a deep breath.    Had uro appt today for stone f/u. On indapamide. Has 3 stones in L kidney,  getting larger. Sched for ureteroscopy (vs lithotripsy). Will also get stent (May 2023).   Has been with current partner for 8 yrs. His mom has dementia.   Has considered cabaneuva, will reconsider in future.   HIV 1 RNA Quant  Date Value  10/31/2020 Not Detected Copies/mL  12/07/2019 25 copies/mL (H)  03/02/2019 24 copies/mL (H)   CD4 T Cell Abs (/uL)  Date Value  10/31/2020 1,351  12/07/2019 1,004  03/02/2019 1,091     Health Maintenance  Topic Date Due   FOOT EXAM  Never done   Zoster Vaccines- Shingrix (1 of 2) Never done   HEMOGLOBIN A1C  06/07/2020   URINE MICROALBUMIN  12/06/2020   OPHTHALMOLOGY EXAM  05/29/2021   COLONOSCOPY (Pts 45-71yrs Insurance coverage will need to be confirmed)  08/19/2024   TETANUS/TDAP  10/27/2026   Pneumococcal Vaccine 46-28 Years old (4 - PPSV23 if available, else PCV20) 08/21/2035   INFLUENZA VACCINE  Completed   COVID-19 Vaccine  Completed   Hepatitis C Screening  Completed   HIV Screening  Completed   HPV VACCINES  Aged Out      Review of Systems  Constitutional:  Negative for chills, fever and weight loss.  Respiratory:  Negative for  cough and shortness of breath.   Cardiovascular:  Negative for chest pain.  Gastrointestinal:  Negative for constipation and diarrhea.  Genitourinary:  Negative for dysuria, hematuria and urgency.   Please see HPI. All other systems reviewed and negative.     Objective:  Physical Exam Constitutional:      Appearance: He is not ill-appearing or diaphoretic.  HENT:     Mouth/Throat:     Mouth: Mucous membranes are moist.     Pharynx: No oropharyngeal exudate.  Eyes:     Extraocular Movements: Extraocular movements intact.     Pupils: Pupils are equal, round, and reactive to light.  Cardiovascular:     Rate and Rhythm: Normal rate and regular rhythm.  Pulmonary:     Effort: Pulmonary effort is normal.     Breath sounds: Normal breath sounds.  Abdominal:     General: Bowel sounds are normal. There is no distension.     Palpations: Abdomen is soft.     Tenderness: There is no abdominal tenderness.  Musculoskeletal:        General: Normal range of motion.     Cervical back: Normal range of motion and neck supple.     Right lower leg: No edema.  Left lower leg: No edema.  Neurological:     General: No focal deficit present.     Mental Status: He is alert.           Assessment & Plan:

## 2021-08-09 NOTE — Assessment & Plan Note (Signed)
Perhaps med change to cabaneuva will help.  Await his f/u on this.

## 2021-08-09 NOTE — Assessment & Plan Note (Signed)
He is doing well with the need for some improvement.  Appreciate his PCP.

## 2021-08-09 NOTE — Assessment & Plan Note (Signed)
Will help him with his insurance drug coverage as needed Considering cabaneuva Needs to complete shingrix Colon is up to date.  In committed relationship, u=u.  Will see him back in 3-4 months.

## 2021-09-04 ENCOUNTER — Other Ambulatory Visit: Payer: Self-pay | Admitting: Infectious Diseases

## 2021-09-04 DIAGNOSIS — B20 Human immunodeficiency virus [HIV] disease: Secondary | ICD-10-CM

## 2021-09-04 MED ORDER — EFAVIRENZ 600 MG PO TABS: ORAL_TABLET | ORAL | 1 refills | Status: DC

## 2021-09-04 MED ORDER — LAMIVUDINE-ZIDOVUDINE 150-300 MG PO TABS: 1.0000 | ORAL_TABLET | Freq: Two times a day (BID) | ORAL | 1 refills | Status: DC

## 2021-09-20 DIAGNOSIS — K6282 Dysplasia of anus: Secondary | ICD-10-CM | POA: Insufficient documentation

## 2021-10-18 ENCOUNTER — Other Ambulatory Visit: Payer: Self-pay | Admitting: Urology

## 2021-11-01 HISTORY — PX: COLONOSCOPY: SHX174

## 2021-11-30 ENCOUNTER — Ambulatory Visit: Payer: Self-pay | Admitting: Infectious Diseases

## 2021-11-30 ENCOUNTER — Other Ambulatory Visit (HOSPITAL_COMMUNITY)
Admission: RE | Admit: 2021-11-30 | Discharge: 2021-11-30 | Disposition: A | Discharge status: Home / Self Care | Payer: PRIVATE HEALTH INSURANCE | Source: Ambulatory Visit | Attending: Infectious Diseases | Admitting: Infectious Diseases

## 2021-11-30 ENCOUNTER — Other Ambulatory Visit: Payer: Self-pay

## 2021-11-30 ENCOUNTER — Other Ambulatory Visit: Payer: PRIVATE HEALTH INSURANCE

## 2021-11-30 DIAGNOSIS — B20 Human immunodeficiency virus [HIV] disease: Secondary | ICD-10-CM

## 2021-11-30 DIAGNOSIS — Z79899 Other long term (current) drug therapy: Secondary | ICD-10-CM

## 2021-11-30 DIAGNOSIS — Z113 Encounter for screening for infections with a predominantly sexual mode of transmission: Secondary | ICD-10-CM | POA: Diagnosis not present

## 2021-11-30 DIAGNOSIS — E0865 Diabetes mellitus due to underlying condition with hyperglycemia: Secondary | ICD-10-CM

## 2021-12-03 LAB — URINE CYTOLOGY ANCILLARY ONLY
Chlamydia: NEGATIVE
Comment: NEGATIVE
Comment: NORMAL
Neisseria Gonorrhea: NEGATIVE

## 2021-12-06 LAB — CBC
HCT: 37.8 % — ABNORMAL LOW (ref 38.5–50.0)
Hemoglobin: 14.2 g/dL (ref 13.2–17.1)
MCH: 44.5 pg — ABNORMAL HIGH (ref 27.0–33.0)
MCHC: 35.7 g/dL (ref 32.0–36.0)
MCV: 118.5 fL — ABNORMAL HIGH (ref 80.0–100.0)
MPV: 10.4 fL (ref 7.5–12.5)
Platelets: 325 10*3/uL (ref 140–400)
RBC: 3.19 10*6/uL — ABNORMAL LOW (ref 4.20–5.80)
RDW: 12.5 % (ref 11.0–15.0)
WBC: 11.1 10*3/uL — ABNORMAL HIGH (ref 3.8–10.8)

## 2021-12-06 LAB — T-HELPER CELLS (CD4) COUNT (NOT AT ARMC)
Absolute CD4: 1566 {cells}/uL (ref 490–1740)
CD4 T Helper %: 40 % (ref 30–61)
Total lymphocyte count: 3932 {cells}/uL — ABNORMAL HIGH (ref 850–3900)

## 2021-12-06 LAB — LIPID PANEL
Cholesterol: 166 mg/dL
HDL: 30 mg/dL — ABNORMAL LOW
Non-HDL Cholesterol (Calc): 136 mg/dL — ABNORMAL HIGH
Total CHOL/HDL Ratio: 5.5 (calc) — ABNORMAL HIGH
Triglycerides: 454 mg/dL — ABNORMAL HIGH

## 2021-12-06 LAB — COMPLETE METABOLIC PANEL WITH GFR
AG Ratio: 1.5 (calc) (ref 1.0–2.5)
ALT: 50 U/L — ABNORMAL HIGH (ref 9–46)
AST: 56 U/L — ABNORMAL HIGH (ref 10–35)
Albumin: 4.8 g/dL (ref 3.6–5.1)
Alkaline phosphatase (APISO): 44 U/L (ref 35–144)
BUN: 19 mg/dL (ref 7–25)
CO2: 26 mmol/L (ref 20–32)
Calcium: 9.9 mg/dL (ref 8.6–10.3)
Chloride: 100 mmol/L (ref 98–110)
Creat: 0.93 mg/dL (ref 0.70–1.30)
Globulin: 3.1 g/dL (calc) (ref 1.9–3.7)
Glucose, Bld: 95 mg/dL (ref 65–99)
Potassium: 3.7 mmol/L (ref 3.5–5.3)
Sodium: 139 mmol/L (ref 135–146)
Total Bilirubin: 0.6 mg/dL (ref 0.2–1.2)
Total Protein: 7.9 g/dL (ref 6.1–8.1)
eGFR: 99 mL/min/{1.73_m2} (ref >=60)

## 2021-12-06 LAB — HIV-1 RNA QUANT-NO REFLEX-BLD
HIV 1 RNA Quant: <20 {copies}/mL — AB
HIV-1 RNA Quant, Log: <1.3 {Log_copies}/mL — AB

## 2021-12-06 LAB — HEMOGLOBIN A1C
Hgb A1c MFr Bld: 5.2 % of total Hgb (ref <5.7)
Mean Plasma Glucose: 103 mg/dL
eAG (mmol/L): 5.7 mmol/L

## 2021-12-06 LAB — RPR: RPR Ser Ql: NONREACTIVE

## 2021-12-14 NOTE — H&P (Signed)
CC/HPI: cc: nephrolithiasis  ? ?08/09/21: 51 year old man with a history of urolithiasis here for follow-up. He was last seen in December 2021. Stones are managed with adapamide and low-sodium diet. KUB shows increase in stone burden on left side now with 3 stones measuring 9 mm 5 mm and 4 mm. He is currently asymptomatic. Patient has undergone ureteroscopy and ESWL x2 in the past. He has also passed stones in the interim without significant pain. He did not tolerate ESWL very well in the past but did do better with ureteroscopy.  ? ?12/07/2021: 51 year old man with a history of urolithiasis and left renal calculi measuring 9 mm, 5 mm and 4 mm here to discuss surgery. He is scheduled for left ureteroscopy on 12/18/2021. We discussed how the surgery performed as well as postoperative follow-up and recovery. 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 He has tolerated stent okay in the past. He has not passed any stones in the last 4 months. He has not yet done a 24-hour urine.  ? ?  ?ALLERGIES: Ampicillin CAPS - Other Reaction, Childhood ?  ? ?MEDICATIONS: ASPIRIN EC PO Daily  ?Combivir 150 mg-300 mg tablet Oral  ?Famotidine TABS Oral  ?Indapamide 2.5 mg tablet 1 tablet PO Daily  ?LORAZEPAM PO Daily  ?Meloxicam TABS Oral  ?Multivitamin  ?Omeprazole 20 mg capsule,delayed release Oral  ?PROCHLORPERAZINE RC PRN  ?Selenium  ?Sustiva 600 mg tablet Oral  ?Valacyclovir 1,000 mg tablet Oral  ?Zolpidem Tartrate 10 mg tablet Oral  ?Zyrtec 10 mg tablet Oral  ?  ? ?GU PSH: Cystoscopy Ureteroscopy - about 1998 ?ESWL - 2017, 2008 ?Repair Ing Hernia; Sliding - about 2015 ? ?  ? ?NON-GU PSH: Colonoscopy - about 2014 ?Laparoscopy, Surgical; Repair Umbilical Hernia - about 2015 ?Tonsillectomy.. - about 1972 ? ?  ? ?GU PMH: Renal calculus, Bilateral - 08/09/2021, Bilateral, KUB not considerably changed from last year. There appeared to be a slight increase in nonobstructing  right renal calculi. Patient is doing well on indapamide and otherwise without complaint. He will follow-up in 1 year with a KUB and will see develops symptoms in the interim., - 08/08/2020, Bilateral, He has bilateral nonobstructing renal calculi., - 2017, Left, Kidney stone on left side, - 2014 ?Renal and ureteral calculus (Stable), Bilateral, His ureteral stone appears to have passed. His urine again remains clear and no stone could be seen along the course of the right ureter on his KUB today. I have recommend he return in 6 months for a KUB to monitor his renal calculi. - 2018, (Stable, Chronic), Right, Will continue with MET for 4 more weeks. F/U w/Dr. Karsten Ro at this time. If stone still not passed and unable to visualize on KUB may need repeat CT urogram. He understands to contact office if he has any acute changes ie temp >100.5, intractable pain, or vomiting. , - 2018 ?Flank Pain (Acute), Right, Secondary to right proximal ureteral stone. - 2018 ?Gross hematuria (Acute), Secondary to right proximal ureteral stone. - 2018 ?Ureteral calculus, Right, We discussed the management of urinary stones. These options include observation, ureteroscopy, shockwave lithotripsy, and PCNL. We discussed which options are relevant to these particular stones. We discussed the natural history of stones as well as the complications of untreated stones and the impact on quality of life without treatment as well as with each of the above listed treatments. We also discussed the efficacy of each treatment in its ability  to clear the stone burden. With any of these management options I discussed the signs and symptoms of infection and the need for emergent treatment should these be experienced. For each option we discussed the ability of each procedure to clear the patient of their stone burden. For observation I described the risks which include but are not limited to silent renal damage, life-threatening infection, need for  emergent surgery, failure to pass stone, and pain. For ureteroscopy I described the risks which include heart attack, stroke, pulmonary embolus, death, bleeding, infection, damage to contiguous structures, positioning injury, ureteral stricture, ureteral avulsion, ureteral injury, need for ureteral stent, inability to perform ureteroscopy, need for an interval procedure, inability to clear stone burden, stent discomfort and pain. For shockwave lithotripsy I described the risks which include arrhythmia, kidney contusion, kidney hemorrhage, need for transfusion, long-term risk of diabetes or hypertension, back discomfort, flank ecchymosis, flank abrasion, inability to break up stone, inability to pass stone fragments, Steinstrasse, infection associated with obstructing stones, need for different surgical procedure and possible need for repeat shockwave lithotripsy. - 2017 ?  ?   ?PMH Notes: History of nephrolithiasis: He had ureteroscopy on the left in 1998 and right lithotripsy for a 5 x 5 UPJ stone in 2002.  ?Stone analysis: 65% calcium oxalate and 35% calcium phosphate  ?24-hour urine: Llow citrate and normal calcium with a good urine volume of 2.8 L.  ?KUB in 2011 and 2012. This showed stable stones over that period of time.  ? ?NON-GU PMH: Cellulitis of left lower limb - about 2018 ?HIV, HIV Infection - 2014 ?Hypercalciuria, Hypercalciuria - 2014 ?Personal history of other diseases of the respiratory system, History of allergic rhinitis - 2014 ?Personal history of other specified conditions, History of heartburn - 2014 ? ?  ? ?FAMILY HISTORY: Diabetes - Mother, Father ?Family Health Status - Father alive at age 26 - Mother ?Family Health Status - Mother's Age - Mother ?Hypertension - Mother, Father ?Hypothyroidism - Mother  ? ?SOCIAL HISTORY: Marital Status: Single ?Preferred Language: [place]; Ethnicity: Not Hispanic Or Latino; Race: White ?Current Smoking Status: Patient has never smoked.  ?Does not use  smokeless tobacco. ?Social Drinker.  ?Does not use drugs. ?Drinks 2 caffeinated drinks per day. 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 ?  ? ?REVIEW OF SYSTEMS:    ?GU Review Male:   Patient denies frequent urination, hard to postpone urination, burning/ pain with urination, get up at night to urinate, leakage of urine, stream starts and stops, trouble starting your stream, have to strain to urinate , erection problems, and penile pain.  ?Gastrointestinal (Upper):   Patient denies nausea, vomiting, and indigestion/ heartburn.  ?Gastrointestinal (Lower):   Patient denies diarrhea and constipation.  ?Constitutional:   Patient denies fever, night sweats, weight loss, and fatigue.  ?Skin:   Patient denies skin rash/ lesion and itching.  ?Eyes:   Patient denies blurred vision and double vision.  ?Ears/ Nose/ Throat:   Patient denies sore throat and sinus problems.  ?Hematologic/Lymphatic:   Patient denies swollen glands and easy bruising.  ?Cardiovascular:   Patient denies leg swelling and chest pains.  ?Respiratory:   Patient denies cough and shortness of breath.  ?Endocrine:   Patient denies excessive thirst.  ?Musculoskeletal:   Patient denies back pain and joint pain.  ?Neurological:   Patient denies headaches and dizziness.  ?Psychologic:   Patient denies depression and anxiety.  ? ?VITAL SIGNS: None  ? ?MULTI-SYSTEM PHYSICAL EXAMINATION:    ?Constitutional: Well-nourished. No physical deformities.  Normally developed. Good grooming.  ?Neck: Neck symmetrical, not swollen. Normal tracheal position.  ?Respiratory: No labored breathing, no use of accessory muscles.   ?Skin: No paleness, no jaundice, no cyanosis. No lesion, no ulcer, no rash.  ?Neurologic / Psychiatric: Oriented to time, oriented to place, oriented to person. No depression, no anxiety, no agitation.  ?Eyes: Normal conjunctivae. Normal eyelids.  ?Ears, Nose, Mouth, and Throat: Left ear no scars, no lesions, no masses. Right ear no scars, no  lesions, no masses. Nose no scars, no lesions, no masses. Normal hearing. Normal lips.  ?Musculoskeletal: Normal gait and station of head and neck.  ? ?  ?Complexity of Data:  ?Records Review:   Previous Patient Records,

## 2021-12-17 ENCOUNTER — Other Ambulatory Visit: Payer: Self-pay

## 2021-12-17 ENCOUNTER — Encounter (HOSPITAL_BASED_OUTPATIENT_CLINIC_OR_DEPARTMENT_OTHER): Payer: Self-pay | Admitting: Urology

## 2021-12-17 NOTE — Progress Notes (Signed)
Spoke w/ via phone for pre-op interview---Alex ?Lab needs dos----EKG per anesthesia, CBC per surgeon               ?Lab results------11/30/2021 CBC, CMP, Hgb A1C, T-helper cells ?COVID test -----patient states asymptomatic no test needed ?Arrive at -------0700 on Tuesday, 12/18/2021 ?NPO after MN NO Solid Food.  Clear liquids from MN until---0600 ?Med rec completed ?Medications to take morning of surgery -----Zyrtec, Thorazine, Pepcid, Flonase ,Valtrex prn, Combivir, Lorazepam prn, Protonix ?Diabetic medication -----Hold Metformin and Trulicity morning of surgery. ?Patient instructed no nail polish to be worn day of surgery ?Patient instructed to bring photo id and insurance card day of surgery ?Patient aware to have Driver (ride ) / caregiver    for 24 hours after surgery - partner, Suszanne Finch ?Patient Special Instructions -----No alcohol 24 hours before surgery. ?Pre-Op special Istructions -----none ?Patient verbalized understanding of instructions that were given at this phone interview. ?Patient denies shortness of breath, chest pain, fever, cough at this phone interview.  ?

## 2021-12-18 ENCOUNTER — Encounter (HOSPITAL_BASED_OUTPATIENT_CLINIC_OR_DEPARTMENT_OTHER): Payer: Self-pay | Admitting: Urology

## 2021-12-18 ENCOUNTER — Other Ambulatory Visit: Payer: Self-pay

## 2021-12-18 ENCOUNTER — Ambulatory Visit (HOSPITAL_BASED_OUTPATIENT_CLINIC_OR_DEPARTMENT_OTHER): Payer: PRIVATE HEALTH INSURANCE | Admitting: Anesthesiology

## 2021-12-18 ENCOUNTER — Ambulatory Visit (HOSPITAL_BASED_OUTPATIENT_CLINIC_OR_DEPARTMENT_OTHER)
Admission: RE | Admit: 2021-12-18 | Discharge: 2021-12-18 | Disposition: A | Discharge status: Home / Self Care | Payer: PRIVATE HEALTH INSURANCE | Source: Ambulatory Visit | Attending: Urology | Admitting: Urology

## 2021-12-18 ENCOUNTER — Encounter (HOSPITAL_BASED_OUTPATIENT_CLINIC_OR_DEPARTMENT_OTHER)
Admission: RE | Disposition: A | Discharge status: Home / Self Care | Payer: Self-pay | Source: Ambulatory Visit | Attending: Urology

## 2021-12-18 DIAGNOSIS — E119 Type 2 diabetes mellitus without complications: Secondary | ICD-10-CM | POA: Diagnosis not present

## 2021-12-18 DIAGNOSIS — N200 Calculus of kidney: Secondary | ICD-10-CM

## 2021-12-18 DIAGNOSIS — Z21 Asymptomatic human immunodeficiency virus [HIV] infection status: Secondary | ICD-10-CM | POA: Insufficient documentation

## 2021-12-18 DIAGNOSIS — B20 Human immunodeficiency virus [HIV] disease: Secondary | ICD-10-CM | POA: Diagnosis not present

## 2021-12-18 DIAGNOSIS — N368 Other specified disorders of urethra: Secondary | ICD-10-CM | POA: Insufficient documentation

## 2021-12-18 HISTORY — DX: Personal history of urinary calculi: Z87.442

## 2021-12-18 HISTORY — DX: HIV-associated lipodystrophy: E88.14

## 2021-12-18 HISTORY — DX: Unspecified convulsions: R56.9

## 2021-12-18 HISTORY — PX: HOLMIUM LASER APPLICATION: SHX5852

## 2021-12-18 HISTORY — DX: Type 2 diabetes mellitus without complications: E11.9

## 2021-12-18 HISTORY — DX: Psychophysiologic insomnia: F51.04

## 2021-12-18 HISTORY — DX: Human immunodeficiency virus (HIV) disease: B20

## 2021-12-18 HISTORY — PX: CYSTOSCOPY WITH RETROGRADE PYELOGRAM, URETEROSCOPY AND STENT PLACEMENT: SHX5789

## 2021-12-18 LAB — CBC
HCT: 34.9 % — ABNORMAL LOW (ref 39.0–52.0)
Hemoglobin: 13.2 g/dL (ref 13.0–17.0)
MCH: 43.6 pg — ABNORMAL HIGH (ref 26.0–34.0)
MCHC: 37.8 g/dL — ABNORMAL HIGH (ref 30.0–36.0)
MCV: 115.2 fL — ABNORMAL HIGH (ref 80.0–100.0)
Platelets: 261 10*3/uL (ref 150–400)
RBC: 3.03 MIL/uL — ABNORMAL LOW (ref 4.22–5.81)
RDW: 13.6 % (ref 11.5–15.5)
WBC: 6 10*3/uL (ref 4.0–10.5)
nRBC: 0 % (ref 0.0–0.2)

## 2021-12-18 LAB — GLUCOSE, CAPILLARY
Glucose-Capillary: 120 mg/dL — ABNORMAL HIGH (ref 70–99)
Glucose-Capillary: 110 mg/dL — ABNORMAL HIGH (ref 70–99)

## 2021-12-18 SURGERY — CYSTOURETEROSCOPY, WITH RETROGRADE PYELOGRAM AND STENT INSERTION
Anesthesia: General | Site: Ureter | Laterality: Left

## 2021-12-18 MED ORDER — ONDANSETRON HCL 4 MG/2ML IJ SOLN
INTRAMUSCULAR | Status: AC
  Filled 2021-12-18: qty 2

## 2021-12-18 MED ORDER — DEXAMETHASONE SODIUM PHOSPHATE 4 MG/ML IJ SOLN
INTRAMUSCULAR | Status: DC | PRN
  Administered 2021-12-18: 10 mg via INTRAVENOUS

## 2021-12-18 MED ORDER — FENTANYL CITRATE (PF) 100 MCG/2ML IJ SOLN
INTRAMUSCULAR | Status: AC
  Filled 2021-12-18: qty 2

## 2021-12-18 MED ORDER — OXYCODONE HCL 5 MG PO TABS
5.0000 mg | ORAL_TABLET | Freq: Once | ORAL | Status: AC | PRN
  Administered 2021-12-18: 5 mg via ORAL

## 2021-12-18 MED ORDER — CIPROFLOXACIN IN D5W 400 MG/200ML IV SOLN
INTRAVENOUS | Status: AC
  Filled 2021-12-18: qty 200

## 2021-12-18 MED ORDER — LIDOCAINE HCL (PF) 2 % IJ SOLN
INTRAMUSCULAR | Status: AC
  Filled 2021-12-18: qty 5

## 2021-12-18 MED ORDER — KETOROLAC TROMETHAMINE 30 MG/ML IJ SOLN: 30.0000 mg | Freq: Once | INTRAMUSCULAR | Status: DC | PRN

## 2021-12-18 MED ORDER — ONDANSETRON HCL 4 MG/2ML IJ SOLN
INTRAMUSCULAR | Status: DC | PRN
  Administered 2021-12-18: 4 mg via INTRAVENOUS

## 2021-12-18 MED ORDER — MIDAZOLAM HCL 5 MG/5ML IJ SOLN
INTRAMUSCULAR | Status: DC | PRN
  Administered 2021-12-18: 2 mg via INTRAVENOUS

## 2021-12-18 MED ORDER — FENTANYL CITRATE (PF) 100 MCG/2ML IJ SOLN
25.0000 ug | INTRAMUSCULAR | Status: DC | PRN
  Administered 2021-12-18: 50 ug via INTRAVENOUS
  Administered 2021-12-18 (×2): 25 ug via INTRAVENOUS

## 2021-12-18 MED ORDER — TRAMADOL HCL 50 MG PO TABS
50.0000 mg | ORAL_TABLET | Freq: Four times a day (QID) | ORAL | 0 refills | Status: DC | PRN
Start: 2021-12-18 — End: 2022-09-24

## 2021-12-18 MED ORDER — IOHEXOL 300 MG/ML  SOLN
INTRAMUSCULAR | Status: DC | PRN
  Administered 2021-12-18: 10 mL

## 2021-12-18 MED ORDER — OXYCODONE HCL 5 MG/5ML PO SOLN: 5.0000 mg | Freq: Once | ORAL | Status: AC | PRN

## 2021-12-18 MED ORDER — FENTANYL CITRATE (PF) 100 MCG/2ML IJ SOLN
INTRAMUSCULAR | Status: DC | PRN
  Administered 2021-12-18 (×8): 25 ug via INTRAVENOUS

## 2021-12-18 MED ORDER — LACTATED RINGERS IV SOLN: INTRAVENOUS | Status: DC

## 2021-12-18 MED ORDER — ONDANSETRON HCL 4 MG/2ML IJ SOLN: 4.0000 mg | Freq: Once | INTRAMUSCULAR | Status: DC | PRN

## 2021-12-18 MED ORDER — PROPOFOL 10 MG/ML IV BOLUS
INTRAVENOUS | Status: DC | PRN
  Administered 2021-12-18: 200 mg via INTRAVENOUS

## 2021-12-18 MED ORDER — OXYCODONE HCL 5 MG PO TABS
ORAL_TABLET | ORAL | Status: AC
  Filled 2021-12-18: qty 1

## 2021-12-18 MED ORDER — LIDOCAINE HCL (CARDIAC) PF 100 MG/5ML IV SOSY
PREFILLED_SYRINGE | INTRAVENOUS | Status: DC | PRN
Start: 2021-12-18 — End: 2021-12-18
  Administered 2021-12-18: 100 mg via INTRAVENOUS

## 2021-12-18 MED ORDER — CIPROFLOXACIN IN D5W 400 MG/200ML IV SOLN
400.0000 mg | INTRAVENOUS | Status: AC
  Administered 2021-12-18: 400 mg via INTRAVENOUS

## 2021-12-18 MED ORDER — 0.9 % SODIUM CHLORIDE (POUR BTL) OPTIME
TOPICAL | Status: DC | PRN
  Administered 2021-12-18: 500 mL

## 2021-12-18 MED ORDER — DEXAMETHASONE SODIUM PHOSPHATE 10 MG/ML IJ SOLN
INTRAMUSCULAR | Status: AC
  Filled 2021-12-18: qty 1

## 2021-12-18 MED ORDER — TAMSULOSIN HCL 0.4 MG PO CAPS: 0.4000 mg | ORAL_CAPSULE | Freq: Every day | ORAL | 0 refills | Status: DC

## 2021-12-18 MED ORDER — PROPOFOL 10 MG/ML IV BOLUS
INTRAVENOUS | Status: AC
  Filled 2021-12-18: qty 20

## 2021-12-18 MED ORDER — SODIUM CHLORIDE 0.9 % IR SOLN
Status: DC | PRN
Start: 2021-12-18 — End: 2021-12-18
  Administered 2021-12-18: 3000 mL

## 2021-12-18 MED ORDER — CIPROFLOXACIN HCL 500 MG PO TABS: 500.0000 mg | ORAL_TABLET | Freq: Once | ORAL | 0 refills | Status: AC

## 2021-12-18 MED ORDER — MIDAZOLAM HCL 2 MG/2ML IJ SOLN
INTRAMUSCULAR | Status: AC
  Filled 2021-12-18: qty 2

## 2021-12-18 SURGICAL SUPPLY — 23 items
BAG DRAIN URO-CYSTO SKYTR STRL (DRAIN) ×2 IMPLANT
BAG DRN UROCATH (DRAIN) ×1
BASKET ZERO TIP NITINOL 2.4FR (BASKET) ×1 IMPLANT
BSKT STON RTRVL ZERO TP 2.4FR (BASKET) ×1
CATH URET 5FR 28IN OPEN ENDED (CATHETERS) ×2 IMPLANT
CLOTH BEACON ORANGE TIMEOUT ST (SAFETY) ×2 IMPLANT
DRSG IV TEGADERM 3.5X4.5 STRL (GAUZE/BANDAGES/DRESSINGS) ×1 IMPLANT
DRSG TEGADERM 2-3/8X2-3/4 SM (GAUZE/BANDAGES/DRESSINGS) ×1 IMPLANT
DRSG TEGADERM 4X4.75 (GAUZE/BANDAGES/DRESSINGS) IMPLANT
EXTRACTOR STONE 1.7FRX115CM (UROLOGICAL SUPPLIES) IMPLANT
GLOVE BIO SURGEON STRL SZ 6.5 (GLOVE) ×2 IMPLANT
GOWN STRL REUS W/TWL LRG LVL3 (GOWN DISPOSABLE) ×2 IMPLANT
GUIDEWIRE STR DUAL SENSOR (WIRE) ×2 IMPLANT
IV NS IRRIG 3000ML ARTHROMATIC (IV SOLUTION) ×2 IMPLANT
KIT TURNOVER CYSTO (KITS) ×2 IMPLANT
MANIFOLD NEPTUNE II (INSTRUMENTS) ×2 IMPLANT
NS IRRIG 500ML POUR BTL (IV SOLUTION) ×1 IMPLANT
PACK CYSTO (CUSTOM PROCEDURE TRAY) ×2 IMPLANT
STENT URET 6FRX26 CONTOUR (STENTS) ×1 IMPLANT
TRACTIP FLEXIVA PULS ID 200XHI (Laser) IMPLANT
TRACTIP FLEXIVA PULSE ID 200 (Laser) ×2
TUBE CONNECTING 12X1/4 (SUCTIONS) ×2 IMPLANT
TUBING UROLOGY SET (TUBING) ×2 IMPLANT

## 2021-12-18 NOTE — Op Note (Signed)
Preoperative diagnosis: left renal calculi ? ?Postoperative diagnosis: left renal calculi ? ?Procedure: ? ?Cystoscopy ?left ureteroscopy, laser lithotripsy, basket stone extraction ?left 44F x 26cm ureteral stent placement - with tether ?left retrograde pyelography with interpretation ? ?Surgeon: Jacalyn Lefevre, MD ? ?Anesthesia: General ? ?Complications: None ? ?Intraoperative findings:  ?Normal urethra ?Bilateral lobe hypertrophy prostatic urethra ?Bilateral orthotropic ureteral orifices ?left retrograde pyelography demonstrated a filling defect within the left lower pole consistent with the patient?s known calculus without other abnormalities. ?Bladder mucosa normal without masses  ? ?EBL: Minimal ? ?Specimens: ?left renal calculi ? ?Disposition of specimens: [facility] for stone analysis ? ?Indication: Brendan Price is a 51 y.o.   patient with a multiple non-obstructing left renal calculi. After reviewing the management options for treatment, the patient elected to proceed with the above surgical procedure(s). We have discussed the potential benefits and risks of the procedure, side effects of the proposed treatment, the likelihood of the patient achieving the goals of the procedure, and any potential problems that might occur during the procedure or recuperation. Informed consent has been obtained. ? ? ?Description of procedure: ? ?The patient was taken to the operating room and general anesthesia was induced.  The patient was placed in the dorsal lithotomy position, prepped and draped in the usual sterile fashion, and preoperative antibiotics were administered. A preoperative time-out was performed.  ? ?Cystourethroscopy was performed.  The patient?s urethra was examined and was normal.  The bladder was then systematically examined in its entirety. There was no evidence for any bladder tumors, stones, or other mucosal pathology.   ? ?Attention then turned to the left ureteral orifice and a  ureteral catheter was used to intubate the ureteral orifice.  Omnipaque contrast was injected through the ureteral catheter and a retrograde pyelogram was performed with findings as dictated above. ? ?A 0.38 sensor guidewire was then advanced up the left ureter into the renal pelvis under fluoroscopic guidance. A second wire was placed alongside the first wire. A  ureteral access sheath was then advanced over one of the wires.  The inner sheath and wire were removed.  Next, flexible ureteroscopy took place. ? ? 2 larger stones were encountered in the lower pole.The stones were then fragmented with the 242 micron holmium laser fiber.  All stones were then removed from the ureter with a 0 tip basket.  Reinspection of the ureter revealed no remaining visible stones or fragments.  ? ?The wire was then backloaded through the cystoscope and a ureteral stent was advance over the wire using Seldinger technique.  The stent was positioned appropriately under fluoroscopic and cystoscopic guidance.  The wire was then removed with an adequate stent curl noted in the renal pelvis as well as in the bladder. ? ?The bladder was then emptied and the procedure ended.  The patient appeared to tolerate the procedure well and without complications.  The patient was able to be awakened and transferred to the recovery unit in satisfactory condition.  ? ?Disposition: The tether of the stent was left on and secured to the ventral aspect of the patient's penis.  Instructions for removing the stent have been provided to the patient.   ?

## 2021-12-18 NOTE — Anesthesia Preprocedure Evaluation (Signed)
Anesthesia Evaluation  ?Patient identified by MRN, date of birth, ID band ?Patient awake ? ? ? ?Reviewed: ?Allergy & Precautions, NPO status , Patient's Chart, lab work & pertinent test results ? ?Airway ?Mallampati: II ? ?TM Distance: >3 FB ?Neck ROM: Full ? ? ? Dental ?no notable dental hx. ? ?  ?Pulmonary ?neg pulmonary ROS,  ?  ?Pulmonary exam normal ?breath sounds clear to auscultation ? ? ? ? ? ? Cardiovascular ?negative cardio ROS ?Normal cardiovascular exam ?Rhythm:Regular Rate:Normal ? ? ?  ?Neuro/Psych ?negative neurological ROS ? negative psych ROS  ? GI/Hepatic ?Neg liver ROS, GERD  ,  ?Endo/Other  ?diabetes, Well Controlled, Type 2 ? Renal/GU ?negative Renal ROS  ?negative genitourinary ?  ?Musculoskeletal ?negative musculoskeletal ROS ?(+)  ? Abdominal ?  ?Peds ?negative pediatric ROS ?(+)  Hematology ? ?(+) HIV,   ?Anesthesia Other Findings ? ? Reproductive/Obstetrics ?negative OB ROS ? ?  ? ? ? ? ? ? ? ? ? ? ? ? ? ?  ?  ? ? ? ? ? ? ? ? ?Anesthesia Physical ?Anesthesia Plan ? ?ASA: 3 ? ?Anesthesia Plan: General  ? ?Post-op Pain Management: Minimal or no pain anticipated  ? ?Induction: Intravenous ? ?PONV Risk Score and Plan: 2 and Ondansetron and Treatment may vary due to age or medical condition ? ?Airway Management Planned: LMA ? ?Additional Equipment:  ? ?Intra-op Plan:  ? ?Post-operative Plan: Extubation in OR ? ?Informed Consent: I have reviewed the patients History and Physical, chart, labs and discussed the procedure including the risks, benefits and alternatives for the proposed anesthesia with the patient or authorized representative who has indicated his/her understanding and acceptance.  ? ? ? ?Dental advisory given ? ?Plan Discussed with: CRNA and Surgeon ? ?Anesthesia Plan Comments:   ? ? ? ? ? ? ?Anesthesia Quick Evaluation ? ?

## 2021-12-18 NOTE — Discharge Instructions (Addendum)
DISCHARGE INSTRUCTIONS FOR KIDNEY STONE/URETERAL STENT  ? ?MEDICATIONS:  ?1. Resume all your other meds from home  ?2. AZO over the counter can help with the burning/stinging when you urinate. ?3. Tramadol is for moderate/severe pain, otherwise taking up to 1000 mg every 6 hours of plainTylenol will help treat your pain.   ?4. Take Cipro one hour prior to removal of your stent.  ?5.  Tamsulosin can help with stent discomfort ? ? ?ACTIVITY:  ?1. No strenuous activity x 1week  ?2. No driving while on narcotic pain medications  ?3. Drink plenty of water  ?4. Continue to walk at home - you can still get blood clots when you are at home, so keep active, but don't over do it.  ?5. May return to work/school tomorrow or when you feel ready  ? ?BATHING:  ?1. You can shower and we recommend daily showers  ?2. You have a string coming from your urethra: The stent string is attached to your ureteral stent. Do not pull on this.  ? ?SIGNS/SYMPTOMS TO CALL:  ?Please call us if you have a fever greater than 101.5, uncontrolled nausea/vomiting, uncontrolled pain, dizziness, unable to urinate, bloody urine, chest pain, shortness of breath, leg swelling, leg pain, redness around wound, drainage from wound, or any other concerns or questions.  ? ?You can reach [place] at [phone].  ? ?FOLLOW-UP:  ?1. You have a string attached to your stent, you may remove it on Friday, May 5. To do this, pull the string until the stent is completely removed. You may feel an odd sensation in your back. (The office can contact you for nurse visit for stent removal) ? ? ?Post Anesthesia Home Care Instructions ? ?Activity: ?Get plenty of rest for the remainder of the day. A responsible individual must stay with you for 24 hours following the procedure.  ?For the next 24 hours, DO NOT: ?-Drive a car ?-Paediatric nurse ?-Drink alcoholic beverages ?-Take any medication unless instructed by your physician ?-Make any legal decisions or sign important  papers. ? ?Meals: ?Start with liquid foods such as gelatin or soup. Progress to regular foods as tolerated. Avoid greasy, spicy, heavy foods. If nausea and/or vomiting occur, drink only clear liquids until the nausea and/or vomiting subsides. Call your physician if vomiting continues. ? ?Special Instructions/Symptoms: ?Your throat may feel dry or sore from the anesthesia or the breathing tube placed in your throat during surgery. If this causes discomfort, gargle with warm salt water. The discomfort should disappear within 24 hours. ? ? ?   ? ?

## 2021-12-18 NOTE — Anesthesia Postprocedure Evaluation (Signed)
Anesthesia Post Note ? ?Patient: Brendan Price ? ?Procedure(s) Performed: CYSTOSCOPY WITH RETROGRADE PYELOGRAM, URETEROSCOPY AND STENT PLACEMENT (Left: Ureter) ?HOLMIUM LASER APPLICATION (Left: Ureter) ? ?  ? ?Patient location during evaluation: PACU ?Anesthesia Type: General ?Level of consciousness: awake and alert ?Pain management: pain level controlled ?Vital Signs Assessment: post-procedure vital signs reviewed and stable ?Respiratory status: spontaneous breathing, nonlabored ventilation, respiratory function stable and patient connected to nasal cannula oxygen ?Cardiovascular status: blood pressure returned to baseline and stable ?Postop Assessment: no apparent nausea or vomiting ?Anesthetic complications: no ? ? ?No notable events documented. ? ?Last Vitals:  ?Vitals:  ? 12/18/21 0736 12/18/21 1006  ?BP: (!) 122/95 (!) 115/91  ?Pulse: 82 87  ?Resp: 16 10  ?Temp: 36.4 ?C (!) 36.4 ?C  ?SpO2: 100% 99%  ?  ?Last Pain:  ?Vitals:  ? 12/18/21 1015  ?TempSrc:   ?PainSc: 9   ? ? ?  ?  ?  ?  ?  ?  ? ?Kagan Mutchler S ? ? ? ? ?

## 2021-12-18 NOTE — Transfer of Care (Signed)
Immediate Anesthesia Transfer of Care Note ? ?Patient: Brendan Price ? ?Procedure(s) Performed: Procedure(s) (LRB): ?CYSTOSCOPY WITH RETROGRADE PYELOGRAM, URETEROSCOPY AND STENT PLACEMENT (Left) ?HOLMIUM LASER APPLICATION (Left) ? ?Patient Location: PACU ? ?Anesthesia Type: General ? ?Level of Consciousness: awake, sedated, patient cooperative and responds to stimulation ? ?Airway & Oxygen Therapy: Patient Spontanous Breathing and Patient connected to Sehili 02 and soft FM  ? ?Post-op Assessment: Report given to PACU RN, Post -op Vital signs reviewed and stable and Patient moving all extremities ? ?Post vital signs: Reviewed and stable ? ?Complications: No apparent anesthesia complications ?

## 2021-12-18 NOTE — Interval H&P Note (Signed)
History and Physical Interval Note: ? ?12/18/2021 ?7:29 AM ? ?Brendan Price  has presented today for surgery, with the diagnosis of LEFT RENAL CALCULUS.  The various methods of treatment have been discussed with the patient and family. After consideration of risks, benefits and other options for treatment, the patient has consented to  Procedure(s): ?CYSTOSCOPY WITH RETROGRADE PYELOGRAM, URETEROSCOPY AND STENT PLACEMENT (Left) ?HOLMIUM LASER APPLICATION (Left) as a surgical intervention.  The patient's history has been reviewed, patient examined, no change in status, stable for surgery.  I have reviewed the patient's chart and labs.  Questions were answered to the patient's satisfaction.   ? ? ?[name] D Adreanna Fickel ? ? ?

## 2021-12-18 NOTE — Anesthesia Procedure Notes (Signed)
Procedure Name: LMA Insertion ?Date/Time: 12/18/2021 8:55 AM ?Performed by: Justice Rocher, CRNA ?Pre-anesthesia Checklist: Patient identified, Emergency Drugs available, Suction available, Patient being monitored and Timeout performed ?Patient Re-evaluated:Patient Re-evaluated prior to induction ?Oxygen Delivery Method: Circle system utilized ?Preoxygenation: Pre-oxygenation with 100% oxygen ?Induction Type: IV induction ?Ventilation: Mask ventilation without difficulty ?LMA: LMA inserted ?LMA Size: 4.0 ?Number of attempts: 1 ?Airway Equipment and Method: Bite block ?Placement Confirmation: positive ETCO2, breath sounds checked- equal and bilateral and CO2 detector ?Tube secured with: Tape ?Dental Injury: Teeth and Oropharynx as per pre-operative assessment  ? ? ? ? ?

## 2021-12-19 ENCOUNTER — Encounter (HOSPITAL_BASED_OUTPATIENT_CLINIC_OR_DEPARTMENT_OTHER): Payer: Self-pay | Admitting: Urology

## 2021-12-20 ENCOUNTER — Encounter: Payer: Self-pay | Admitting: Infectious Diseases

## 2021-12-20 ENCOUNTER — Other Ambulatory Visit: Payer: Self-pay

## 2021-12-20 ENCOUNTER — Ambulatory Visit (INDEPENDENT_AMBULATORY_CARE_PROVIDER_SITE_OTHER): Payer: PRIVATE HEALTH INSURANCE | Admitting: Infectious Diseases

## 2021-12-20 VITALS — BP 133/94 | HR 110 | Temp 97.9°F | Resp 16 | Wt 164.0 lb

## 2021-12-20 DIAGNOSIS — Z113 Encounter for screening for infections with a predominantly sexual mode of transmission: Secondary | ICD-10-CM | POA: Diagnosis not present

## 2021-12-20 DIAGNOSIS — B20 Human immunodeficiency virus [HIV] disease: Secondary | ICD-10-CM

## 2021-12-20 DIAGNOSIS — E0865 Diabetes mellitus due to underlying condition with hyperglycemia: Secondary | ICD-10-CM

## 2021-12-20 DIAGNOSIS — N200 Calculus of kidney: Secondary | ICD-10-CM

## 2021-12-20 DIAGNOSIS — K6282 Dysplasia of anus: Secondary | ICD-10-CM

## 2021-12-20 MED ORDER — EFAVIRENZ 600 MG PO TABS: ORAL_TABLET | ORAL | 1 refills | Status: DC

## 2021-12-20 MED ORDER — LAMIVUDINE-ZIDOVUDINE 150-300 MG PO TABS: 1.0000 | ORAL_TABLET | Freq: Two times a day (BID) | ORAL | 1 refills | Status: DC

## 2021-12-20 NOTE — Assessment & Plan Note (Signed)
He is doing well, A1C better.  ?Appreciate his PCP f/u.  ?

## 2021-12-20 NOTE — Progress Notes (Signed)
? ?Subjective:  ? ? Patient ID: Brendan Price, male    DOB: 12-26-1970, 51 y.o.   MRN: [id number] ? ?HPI ?50 yo M diagnosed with HIV in 2002.  He remains on his initial ART of CBV/EFV ?Prev had nephrolithiasis. Had laser of stone, basket extraction and stent placement on 12-18-21. ?Long term partner Gwyndolyn Saxon) ?  ?Has been dx as DM (07-2019)- on metformin. Initial A1C 5.2% (11-2021). Monitors at home, not regularly.  ?Eye exam 09-04-21. Over 1 month had inflamed capillaries on his L sclera.No change in his vision (minimal per his ophtho).  ?Had colon 09-2021 found to have multiple ppolyps (tubular adenomas on path) as well as AIN1.  ?  ?On fenofibrate. Trig are now much better. Has increased his ASA dose as well due to hx of CVA and MI in family. ('162mg'$  bid), also on pepcid, omeprazole.  ?  ?L Inguinal hernia has not been repaired yet. Can feel popping in and out.  ?  ?Has been with current partner for 9 yrs. ?His mom has dementia. Has help from his brother.  ? ?Has not been exercising as much (work fatigue) ?  ? ?Lab Results  ?Component Value Date  ? CHOL 166 11/30/2021  ? HDL 30 (L) 11/30/2021  ? [facility]  11/30/2021  ?   Comment:  ?   . ?LDL cholesterol not calculated. Triglyceride levels ?greater than 400 mg/dL invalidate calculated LDL results. ?. ?Reference range: <100 ?Marland Kitchen ?Desirable range <100 mg/dL for primary prevention;   ?<70 mg/dL for patients with CHD or diabetic patients  ?with > or = 2 CHD risk factors. ?. ?LDL-C is now calculated using the Martin-Hopkins  ?calculation, which is a validated novel method providing  ?better accuracy than the Friedewald equation in the  ?estimation of LDL-C.  ?Cresenciano Genre et al. Annamaria Helling. 9211;941(74): 2061-2068  ?(http://education.[facility].com/faq/FAQ164) ?  ? TRIG 454 (H) 11/30/2021  ? CHOLHDL 5.5 (H) 11/30/2021  ?  ? ? ?HIV 1 RNA Quant  ?Date Value  ?11/30/2021 <20 DETECTED copies/mL (A)  ?10/31/2020 Not Detected Copies/mL  ?12/07/2019 25 copies/mL (H)  ? ?CD4 T Cell Abs  (/uL)  ?Date Value  ?10/31/2020 1,351  ?12/07/2019 1,004  ?03/02/2019 1,091  ? ? ?Review of Systems  ?Constitutional:  Negative for chills, fever and unexpected weight change.  ?Respiratory:  Negative for cough and shortness of breath.   ?Gastrointestinal:  Negative for constipation and diarrhea.  ?Genitourinary:  Positive for dysuria. Negative for difficulty urinating.  ?Neurological:  Negative for numbness.  ? ?   ?Objective:  ? Physical Exam ?Vitals reviewed.  ?Constitutional:   ?   General: He is not in acute distress. ?   Appearance: He is normal weight. He is not ill-appearing, toxic-appearing or diaphoretic.  ?HENT:  ?   Mouth/Throat:  ?   Mouth: Mucous membranes are moist.  ?   Pharynx: No oropharyngeal exudate.  ?Eyes:  ?   Extraocular Movements: Extraocular movements intact.  ?   Pupils: Pupils are equal, round, and reactive to light.  ?Cardiovascular:  ?   Rate and Rhythm: Normal rate and regular rhythm.  ?Pulmonary:  ?   Effort: Pulmonary effort is normal.  ?   Breath sounds: Normal breath sounds.  ?Abdominal:  ?   General: Bowel sounds are normal. There is no distension.  ?   Palpations: Abdomen is soft.  ?   Tenderness: There is no abdominal tenderness.  ?Musculoskeletal:  ?   Cervical back: Normal range of motion and neck  supple.  ?   Right lower leg: No edema.  ?   Left lower leg: No edema.  ?Neurological:  ?   General: No focal deficit present.  ?   Mental Status: He is alert.  ?   Sensory: No sensory deficit.  ? ? ? ? ?   ?Assessment & Plan:  ? ? ?

## 2021-12-20 NOTE — Assessment & Plan Note (Signed)
Appreciate uro f/u.  ?

## 2021-12-20 NOTE — Assessment & Plan Note (Signed)
We spoke at length regarding this.  ?He will f/u with GI for surveillance.  ?

## 2021-12-20 NOTE — Assessment & Plan Note (Signed)
He is doing well.  ?With long term partner.  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  ?rtc in 9 months.  ?rec to get will complete shingrix series.  ?COVID booster pending.  ?

## 2022-01-20 ENCOUNTER — Encounter: Payer: Self-pay | Admitting: Infectious Diseases

## 2022-01-21 ENCOUNTER — Encounter: Payer: Self-pay | Admitting: Infectious Diseases

## 2022-05-02 ENCOUNTER — Other Ambulatory Visit: Payer: Self-pay | Admitting: Infectious Diseases

## 2022-05-02 DIAGNOSIS — E781 Pure hyperglyceridemia: Secondary | ICD-10-CM

## 2022-05-02 DIAGNOSIS — F419 Anxiety disorder, unspecified: Secondary | ICD-10-CM

## 2022-05-02 DIAGNOSIS — E0865 Diabetes mellitus due to underlying condition with hyperglycemia: Secondary | ICD-10-CM

## 2022-05-02 DIAGNOSIS — K219 Gastro-esophageal reflux disease without esophagitis: Secondary | ICD-10-CM

## 2022-05-02 DIAGNOSIS — B20 Human immunodeficiency virus [HIV] disease: Secondary | ICD-10-CM

## 2022-05-02 MED ORDER — FENOFIBRATE 145 MG PO TABS: 145.0000 mg | ORAL_TABLET | Freq: Every evening | ORAL | 3 refills | Status: DC

## 2022-05-02 MED ORDER — METFORMIN HCL ER 500 MG PO TB24: 1000.0000 mg | ORAL_TABLET | Freq: Two times a day (BID) | ORAL | 3 refills | Status: DC

## 2022-05-02 MED ORDER — EFAVIRENZ 600 MG PO TABS: ORAL_TABLET | ORAL | 1 refills | Status: DC

## 2022-05-02 MED ORDER — PANTOPRAZOLE SODIUM 40 MG PO TBEC: 40.0000 mg | DELAYED_RELEASE_TABLET | Freq: Every day | ORAL | 1 refills | Status: DC

## 2022-05-02 MED ORDER — LORAZEPAM 1 MG PO TABS: 1.0000 mg | ORAL_TABLET | Freq: Three times a day (TID) | ORAL | 3 refills | Status: DC | PRN

## 2022-05-02 MED ORDER — ZOLPIDEM TARTRATE 10 MG PO TABS: 10.0000 mg | ORAL_TABLET | Freq: Every evening | ORAL | 3 refills | Status: DC | PRN

## 2022-05-02 MED ORDER — FAMOTIDINE 40 MG PO TABS: 40.0000 mg | ORAL_TABLET | Freq: Every day | ORAL | 3 refills | Status: DC

## 2022-05-02 MED ORDER — TRULICITY 0.75 MG/0.5ML ~~LOC~~ SOAJ: SUBCUTANEOUS | 3 refills | Status: DC

## 2022-05-02 NOTE — Progress Notes (Signed)
Called in to mail order

## 2022-05-02 NOTE — Progress Notes (Signed)
Rx's refilled, pharmacy called. Pmp checked. I am assuming his rx's from dr Laurann Montana

## 2022-07-25 ENCOUNTER — Other Ambulatory Visit: Payer: Self-pay | Admitting: Infectious Diseases

## 2022-07-25 DIAGNOSIS — B20 Human immunodeficiency virus [HIV] disease: Secondary | ICD-10-CM

## 2022-07-25 MED ORDER — LAMIVUDINE-ZIDOVUDINE 150-300 MG PO TABS: 1.0000 | ORAL_TABLET | Freq: Two times a day (BID) | ORAL | 3 refills | Status: DC

## 2022-07-25 MED ORDER — EFAVIRENZ 600 MG PO TABS: ORAL_TABLET | ORAL | 3 refills | Status: DC

## 2022-07-30 ENCOUNTER — Other Ambulatory Visit: Payer: Self-pay | Admitting: Infectious Diseases

## 2022-07-30 DIAGNOSIS — B20 Human immunodeficiency virus [HIV] disease: Secondary | ICD-10-CM

## 2022-07-30 MED ORDER — EFAVIRENZ 600 MG PO TABS: ORAL_TABLET | ORAL | 3 refills | Status: DC

## 2022-09-24 ENCOUNTER — Encounter: Payer: Self-pay | Admitting: Infectious Diseases

## 2022-09-24 ENCOUNTER — Ambulatory Visit: Payer: PRIVATE HEALTH INSURANCE | Admitting: Infectious Diseases

## 2022-09-24 ENCOUNTER — Other Ambulatory Visit (HOSPITAL_COMMUNITY)
Admission: RE | Admit: 2022-09-24 | Discharge: 2022-09-24 | Disposition: A | Discharge status: Home / Self Care | Payer: PRIVATE HEALTH INSURANCE | Source: Ambulatory Visit | Attending: Infectious Diseases | Admitting: Infectious Diseases

## 2022-09-24 ENCOUNTER — Other Ambulatory Visit: Payer: Self-pay

## 2022-09-24 VITALS — BP 114/86 | HR 78 | Temp 97.8°F | Ht 71.0 in | Wt 168.5 lb

## 2022-09-24 DIAGNOSIS — E0865 Diabetes mellitus due to underlying condition with hyperglycemia: Secondary | ICD-10-CM

## 2022-09-24 DIAGNOSIS — J309 Allergic rhinitis, unspecified: Secondary | ICD-10-CM

## 2022-09-24 DIAGNOSIS — E1165 Type 2 diabetes mellitus with hyperglycemia: Secondary | ICD-10-CM

## 2022-09-24 DIAGNOSIS — Z7984 Long term (current) use of oral hypoglycemic drugs: Secondary | ICD-10-CM

## 2022-09-24 DIAGNOSIS — K649 Unspecified hemorrhoids: Secondary | ICD-10-CM | POA: Insufficient documentation

## 2022-09-24 DIAGNOSIS — F419 Anxiety disorder, unspecified: Secondary | ICD-10-CM | POA: Diagnosis not present

## 2022-09-24 DIAGNOSIS — Z113 Encounter for screening for infections with a predominantly sexual mode of transmission: Secondary | ICD-10-CM

## 2022-09-24 DIAGNOSIS — N200 Calculus of kidney: Secondary | ICD-10-CM

## 2022-09-24 DIAGNOSIS — E781 Pure hyperglyceridemia: Secondary | ICD-10-CM

## 2022-09-24 DIAGNOSIS — H35719 Central serous chorioretinopathy, unspecified eye: Secondary | ICD-10-CM | POA: Diagnosis not present

## 2022-09-24 DIAGNOSIS — A601 Herpesviral infection of perianal skin and rectum: Secondary | ICD-10-CM

## 2022-09-24 DIAGNOSIS — K640 First degree hemorrhoids: Secondary | ICD-10-CM

## 2022-09-24 DIAGNOSIS — K219 Gastro-esophageal reflux disease without esophagitis: Secondary | ICD-10-CM

## 2022-09-24 DIAGNOSIS — B20 Human immunodeficiency virus [HIV] disease: Secondary | ICD-10-CM

## 2022-09-24 MED ORDER — TRULICITY 0.75 MG/0.5ML ~~LOC~~ SOAJ: SUBCUTANEOUS | 3 refills | Status: DC

## 2022-09-24 MED ORDER — EFAVIRENZ 600 MG PO TABS: ORAL_TABLET | ORAL | 3 refills | Status: DC

## 2022-09-24 MED ORDER — PANTOPRAZOLE SODIUM 40 MG PO TBEC: 40.0000 mg | DELAYED_RELEASE_TABLET | Freq: Every day | ORAL | 1 refills | Status: DC

## 2022-09-24 MED ORDER — LORAZEPAM 1 MG PO TABS: 1.0000 mg | ORAL_TABLET | Freq: Two times a day (BID) | ORAL | 3 refills | Status: DC

## 2022-09-24 MED ORDER — FAMOTIDINE 40 MG PO TABS: 40.0000 mg | ORAL_TABLET | Freq: Every day | ORAL | 3 refills | Status: DC

## 2022-09-24 MED ORDER — INDAPAMIDE 2.5 MG PO TABS: 2.5000 mg | ORAL_TABLET | ORAL | 3 refills | Status: DC

## 2022-09-24 MED ORDER — FENOFIBRATE 145 MG PO TABS: 145.0000 mg | ORAL_TABLET | Freq: Every evening | ORAL | 3 refills | Status: DC

## 2022-09-24 MED ORDER — LAMIVUDINE-ZIDOVUDINE 150-300 MG PO TABS: 1.0000 | ORAL_TABLET | Freq: Two times a day (BID) | ORAL | 3 refills | Status: DC

## 2022-09-24 MED ORDER — METFORMIN HCL ER 500 MG PO TB24: 1000.0000 mg | ORAL_TABLET | Freq: Two times a day (BID) | ORAL | 3 refills | Status: DC

## 2022-09-24 MED ORDER — CETIRIZINE HCL 10 MG PO TABS: 10.0000 mg | ORAL_TABLET | Freq: Two times a day (BID) | ORAL | 3 refills | Status: AC

## 2022-09-24 MED ORDER — ZOLPIDEM TARTRATE 10 MG PO TABS: 10.0000 mg | ORAL_TABLET | Freq: Every evening | ORAL | 3 refills | Status: DC | PRN

## 2022-09-24 MED ORDER — MOMETASONE FUROATE 0.1 % EX CREA: TOPICAL_CREAM | CUTANEOUS | 3 refills | Status: DC

## 2022-09-24 MED ORDER — VALACYCLOVIR HCL 1 G PO TABS: 1000.0000 mg | ORAL_TABLET | Freq: Two times a day (BID) | ORAL | 3 refills | Status: DC | PRN

## 2022-09-24 NOTE — Assessment & Plan Note (Signed)
Will refill his meds  Recheck his A1C today.  He is checking his FSG sporadically.

## 2022-09-24 NOTE — Assessment & Plan Note (Signed)
Renew his VTX.

## 2022-09-24 NOTE — Assessment & Plan Note (Signed)
He is doing well Long term partner Continue EFV/CBV He does not want to change Vax are up todate.  Rtc in 9 months

## 2022-09-24 NOTE — Assessment & Plan Note (Signed)
Renew VTX.

## 2022-09-24 NOTE — Assessment & Plan Note (Signed)
Has f/u with ophtho to get contacts Appreciate their f/u.

## 2022-09-24 NOTE — Addendum Note (Signed)
Addended by: Kashia Brossard C on: 09/24/2022 02:14 PM   Modules accepted: Orders

## 2022-09-24 NOTE — Assessment & Plan Note (Signed)
Check lipids, cont fibrate.

## 2022-09-24 NOTE — Progress Notes (Signed)
Subjective:    Patient ID: Brendan Price, male  DOB: 02-19-1971, 52 y.o.        MRN: [id number]   HPI 52 yo M diagnosed with HIV in 2002.  He remains on his initial ART of CBV/EFV Prev had nephrolithiasis. Had laser of stone, basket extraction and stent placement on 12-18-21.  Long term partner Gwyndolyn Saxon)   Has been dx as DM2 (07-2019)- on metformin. Initial A1C 5.2% (11-2021). Monitors at home, not regularly.  Eye exam 09-04-21. Over 1 month had inflamed capillaries on his L sclera.No change in his vision (minimal per his ophtho). Getting contacts today.   Had colon 09-2021 found to have multiple ppolyps (tubular adenomas on path) as well as AIN1.    On fenofibrate. Trig are now much better. Has increased his ASA dose as well due to hx of CVA and MI in family. ('162mg'$  bid), also on pepcid, omeprazole.    L Inguinal hernia has not been repaired yet. Can feel popping in and out.    Has been with current partner for 10 yrs. His mom has dementia. Has help from his brother.      HIV 1 RNA Quant  Date Value  11/30/2021 <20 DETECTED copies/mL (A)  10/31/2020 Not Detected Copies/mL  12/07/2019 25 copies/mL (H)   CD4 T Cell Abs (/uL)  Date Value  10/31/2020 1,351  12/07/2019 1,004  03/02/2019 1,091   Lab Results  Component Value Date   CHOL 166 11/30/2021   HDL 30 (L) 11/30/2021   [facility]  11/30/2021     Comment:     . LDL cholesterol not calculated. Triglyceride levels greater than 400 mg/dL invalidate calculated LDL results. . Reference range: <100 . Desirable range <100 mg/dL for primary prevention;   <70 mg/dL for patients with CHD or diabetic patients  with > or = 2 CHD risk factors. Marland Kitchen LDL-C is now calculated using the Martin-Hopkins  calculation, which is a validated novel method providing  better accuracy than the Friedewald equation in the  estimation of LDL-C.  Cresenciano Genre et al. Annamaria Helling. 6203;559(74): 2061-2068  (http://education.[facility].com/faq/FAQ164)     TRIG 454 (H) 11/30/2021   CHOLHDL 5.5 (H) 11/30/2021      Health Maintenance  Topic Date Due  . FOOT EXAM  Never done  . Diabetic kidney evaluation - Urine ACR  12/06/2020  . OPHTHALMOLOGY EXAM  05/29/2021  . HEMOGLOBIN A1C  06/01/2022  . COVID-19 Vaccine (9 - 2023-24 season) 07/20/2022  . Diabetic kidney evaluation - eGFR measurement  12/01/2022  . DTaP/Tdap/Td (4 - Td or Tdap) 10/27/2026  . COLONOSCOPY (Pts 45-60yr Insurance coverage will need to be confirmed)  11/02/2031  . INFLUENZA VACCINE  Completed  . Hepatitis C Screening  Completed  . HIV Screening  Completed  . Zoster Vaccines- Shingrix  Completed  . HPV VACCINES  Aged Out      Review of Systems  Constitutional:  Negative for chills, fever and weight loss.  Gastrointestinal:  Negative for blood in stool, constipation and diarrhea.       Pain with BM, question fissure.   Genitourinary:  Negative for dysuria.  Psychiatric/Behavioral:  Negative for depression.    Please see HPI. All other systems reviewed and negative.     Objective:  Physical Exam Vitals reviewed.  Constitutional:      Appearance: Normal appearance. He is normal weight.  HENT:     Mouth/Throat:     Mouth: Mucous membranes are moist.  Pharynx: No oropharyngeal exudate.  Cardiovascular:     Rate and Rhythm: Normal rate and regular rhythm.     Pulses:          Dorsalis pedis pulses are 2+ on the right side and 2+ on the left side.  Pulmonary:     Effort: Pulmonary effort is normal.     Breath sounds: Normal breath sounds.  Abdominal:     General: Bowel sounds are normal.     Palpations: Abdomen is soft.     Comments: Small hemmeroid at 6 o'clock, no fissure, no ulcer.   Musculoskeletal:        General: Normal range of motion.     Cervical back: Normal range of motion and neck supple.     Right lower leg: No edema.     Left lower leg: No edema.     Right foot: Normal range of motion. No deformity.     Left foot: Normal range of  motion. No deformity.  Feet:     Right foot:     Protective Sensation: 2 sites tested.  2 sites sensed.     Skin integrity: Skin integrity normal.     Left foot:     Protective Sensation: 2 sites tested.  2 sites sensed.     Skin integrity: Skin integrity normal.  Neurological:     Mental Status: He is alert.          Assessment & Plan:

## 2022-09-24 NOTE — Assessment & Plan Note (Signed)
Advised to try prep h 1% Cautions about straining.

## 2022-09-24 NOTE — Assessment & Plan Note (Signed)
No new issues 

## 2022-09-25 ENCOUNTER — Other Ambulatory Visit: Payer: Self-pay | Admitting: Infectious Diseases

## 2022-09-25 DIAGNOSIS — F419 Anxiety disorder, unspecified: Secondary | ICD-10-CM

## 2022-09-25 LAB — T-HELPER CELLS (CD4) COUNT (NOT AT ARMC)
CD4 % Helper T Cell: 40 % (ref 33–65)
CD4 T Cell Abs: 1146 /uL (ref 400–1790)

## 2022-09-25 LAB — URINE CYTOLOGY ANCILLARY ONLY
Chlamydia: NEGATIVE
Comment: NEGATIVE
Comment: NORMAL
Neisseria Gonorrhea: NEGATIVE

## 2022-09-25 MED ORDER — LORAZEPAM 1 MG PO TABS: 1.0000 mg | ORAL_TABLET | Freq: Two times a day (BID) | ORAL | 3 refills | Status: DC

## 2022-09-25 MED ORDER — ZOLPIDEM TARTRATE 10 MG PO TABS: 10.0000 mg | ORAL_TABLET | Freq: Every evening | ORAL | 3 refills | Status: DC | PRN

## 2022-09-26 ENCOUNTER — Other Ambulatory Visit: Payer: Self-pay | Admitting: Infectious Diseases

## 2022-09-26 DIAGNOSIS — E0865 Diabetes mellitus due to underlying condition with hyperglycemia: Secondary | ICD-10-CM

## 2022-09-26 DIAGNOSIS — F419 Anxiety disorder, unspecified: Secondary | ICD-10-CM

## 2022-09-26 LAB — CBC
Hematocrit: 37.4 % — ABNORMAL LOW (ref 37.5–51.0)
Hemoglobin: 13.9 g/dL (ref 13.0–17.7)
MCH: 42.9 pg — ABNORMAL HIGH (ref 26.6–33.0)
MCHC: 37.2 g/dL — ABNORMAL HIGH (ref 31.5–35.7)
MCV: 115 fL — ABNORMAL HIGH (ref 79–97)
Platelets: 279 10*3/uL (ref 150–450)
RBC: 3.24 x10E6/uL — ABNORMAL LOW (ref 4.14–5.80)
RDW: 11.6 % (ref 11.6–15.4)
WBC: 7.1 10*3/uL (ref 3.4–10.8)

## 2022-09-26 LAB — LIPID PANEL
Chol/HDL Ratio: 5.9 ratio — ABNORMAL HIGH (ref 0.0–5.0)
Cholesterol, Total: 164 mg/dL (ref 100–199)
HDL: 28 mg/dL — ABNORMAL LOW (ref >39)
LDL Chol Calc (NIH): 84 mg/dL (ref 0–99)
Triglycerides: 316 mg/dL — ABNORMAL HIGH (ref 0–149)
VLDL Cholesterol Cal: 52 mg/dL — ABNORMAL HIGH (ref 5–40)

## 2022-09-26 LAB — COMPREHENSIVE METABOLIC PANEL
ALT: 73 IU/L — ABNORMAL HIGH (ref 0–44)
AST: 65 IU/L — ABNORMAL HIGH (ref 0–40)
Albumin/Globulin Ratio: 1.7 (ref 1.2–2.2)
Albumin: 4.6 g/dL (ref 3.8–4.9)
Alkaline Phosphatase: 44 IU/L (ref 44–121)
BUN/Creatinine Ratio: 19 (ref 9–20)
BUN: 18 mg/dL (ref 6–24)
Bilirubin Total: 0.5 mg/dL (ref 0.0–1.2)
CO2: 22 mmol/L (ref 20–29)
Calcium: 9.9 mg/dL (ref 8.7–10.2)
Chloride: 100 mmol/L (ref 96–106)
Creatinine, Ser: 0.97 mg/dL (ref 0.76–1.27)
Globulin, Total: 2.7 g/dL (ref 1.5–4.5)
Glucose: 103 mg/dL — ABNORMAL HIGH (ref 70–99)
Potassium: 3.7 mmol/L (ref 3.5–5.2)
Sodium: 139 mmol/L (ref 134–144)
Total Protein: 7.3 g/dL (ref 6.0–8.5)
eGFR: 94 mL/min/{1.73_m2} (ref >59)

## 2022-09-26 LAB — HIV-1 RNA QUANT-NO REFLEX-BLD: HIV-1 RNA Viral Load: <20 copies/mL

## 2022-09-26 LAB — HEMOGLOBIN A1C
Est. average glucose Bld gHb Est-mCnc: 111 mg/dL
Hgb A1c MFr Bld: 5.5 % (ref 4.8–5.6)

## 2022-09-26 LAB — RPR: RPR Ser Ql: NONREACTIVE

## 2022-09-26 MED ORDER — METFORMIN HCL ER 500 MG PO TB24: 1000.0000 mg | ORAL_TABLET | Freq: Two times a day (BID) | ORAL | 3 refills | Status: DC

## 2022-09-26 MED ORDER — LORAZEPAM 1 MG PO TABS: 1.0000 mg | ORAL_TABLET | Freq: Two times a day (BID) | ORAL | 3 refills | Status: DC

## 2022-09-26 MED ORDER — ZOLPIDEM TARTRATE 10 MG PO TABS: 10.0000 mg | ORAL_TABLET | Freq: Every evening | ORAL | 3 refills | Status: DC | PRN

## 2022-09-26 MED ORDER — METFORMIN HCL ER 500 MG PO TB24: 1000.0000 mg | ORAL_TABLET | Freq: Two times a day (BID) | ORAL | 0 refills | Status: DC

## 2022-10-11 ENCOUNTER — Other Ambulatory Visit (HOSPITAL_COMMUNITY): Payer: Self-pay

## 2022-12-16 ENCOUNTER — Other Ambulatory Visit: Payer: Self-pay | Admitting: Infectious Diseases

## 2022-12-16 DIAGNOSIS — E0865 Diabetes mellitus due to underlying condition with hyperglycemia: Secondary | ICD-10-CM

## 2022-12-16 DIAGNOSIS — Z113 Encounter for screening for infections with a predominantly sexual mode of transmission: Secondary | ICD-10-CM

## 2022-12-16 DIAGNOSIS — B20 Human immunodeficiency virus [HIV] disease: Secondary | ICD-10-CM

## 2023-01-07 ENCOUNTER — Ambulatory Visit: Payer: PRIVATE HEALTH INSURANCE | Admitting: Infectious Diseases

## 2023-01-07 ENCOUNTER — Encounter: Payer: Self-pay | Admitting: Infectious Diseases

## 2023-01-07 VITALS — BP 136/85 | HR 76 | Temp 98.2°F | Ht 71.0 in | Wt 169.5 lb

## 2023-01-07 DIAGNOSIS — K6282 Dysplasia of anus: Secondary | ICD-10-CM

## 2023-01-07 DIAGNOSIS — B20 Human immunodeficiency virus [HIV] disease: Secondary | ICD-10-CM

## 2023-01-07 DIAGNOSIS — E1165 Type 2 diabetes mellitus with hyperglycemia: Secondary | ICD-10-CM | POA: Diagnosis not present

## 2023-01-07 DIAGNOSIS — E781 Pure hyperglyceridemia: Secondary | ICD-10-CM

## 2023-01-07 DIAGNOSIS — Z7984 Long term (current) use of oral hypoglycemic drugs: Secondary | ICD-10-CM

## 2023-01-07 DIAGNOSIS — Z79899 Other long term (current) drug therapy: Secondary | ICD-10-CM

## 2023-01-07 DIAGNOSIS — E0865 Diabetes mellitus due to underlying condition with hyperglycemia: Secondary | ICD-10-CM

## 2023-01-07 DIAGNOSIS — Z113 Encounter for screening for infections with a predominantly sexual mode of transmission: Secondary | ICD-10-CM

## 2023-01-07 NOTE — Assessment & Plan Note (Signed)
Plan for colon 2025

## 2023-01-07 NOTE — Assessment & Plan Note (Addendum)
He is doing well  We spoke about changing his visit freq This is his annual wellness/physical visit.  Will see him back in Nov Will get PCV 20 at work

## 2023-01-07 NOTE — Addendum Note (Signed)
Addended by: Bufford Spikes on: 01/07/2023 04:41 PM   Modules accepted: Orders

## 2023-01-07 NOTE — Assessment & Plan Note (Signed)
    Latest Ref Rng & Units 09/24/2022    2:12 PM 11/30/2021    2:04 PM 10/31/2020    4:16 PM  Hepatic Function  Total Protein 6.0 - 8.5 g/dL 7.3  7.9  7.5   Albumin 3.8 - 4.9 g/dL 4.6     AST 0 - 40 IU/L 65  56  60   ALT 0 - 44 IU/L 73  50  86   Alk Phosphatase 44 - 121 IU/L 44     Total Bilirubin 0.0 - 1.2 mg/dL 0.5  0.6  0.7    Lipid Panel     Component Value Date/Time   CHOL 164 09/24/2022 1412   TRIG 316 (H) 09/24/2022 1412   HDL 28 (L) 09/24/2022 1412   CHOLHDL 5.9 (H) 09/24/2022 1412   CHOLHDL 5.5 (H) 11/30/2021 1404   VLDL NOT CALC 03/06/2016 1018   LDLCALC 84 09/24/2022 1412   LDLCALC  11/30/2021 1404     Comment:     . LDL cholesterol not calculated. Triglyceride levels greater than 400 mg/dL invalidate calculated LDL results. . Reference range: <100 . Desirable range <100 mg/dL for primary prevention;   <70 mg/dL for patients with CHD or diabetic patients  with > or = 2 CHD risk factors. Marland Kitchen LDL-C is now calculated using the Martin-Hopkins  calculation, which is a validated novel method providing  better accuracy than the Friedewald equation in the  estimation of LDL-C.  Horald Pollen et al. Lenox Ahr. 4098;119(14): 2061-2068  (http://education.[facility].com/faq/FAQ164)    LABVLDL 52 (H) 09/24/2022 1412

## 2023-01-07 NOTE — Assessment & Plan Note (Addendum)
Continues on his current rx Will check his A1C at f/u as well as UA

## 2023-01-07 NOTE — Progress Notes (Signed)
Subjective:    Patient ID: Brendan Price, male  DOB: 04-Jul-1971, 52 y.o.        MRN: [id number]   HPI 52 yo M diagnosed with HIV in 2002.  He remains on his initial ART of CBV/EFV Prev had nephrolithiasis. Had laser of stone, basket extraction and stent placement on 12-18-21.    Has been dx as DM2 (07-2019)- on metformin. Initial A1C 5.2% (11-2021). Monitors at home, not regularly. Thinks his sugars have been "ok". Is going to get new home monitor. Still on trulicity and metformin.  Eye exam 09-04-21. Over 1 month had inflamed capillaries on his L sclera.No change in his vision (minimal per his ophtho). Vision has been good.    Had colon 09-2021 found to have multiple polyps (tubular adenomas on path) as well as AIN1.  Repeat set for 2033, we discussed 5 yr instead. Dr Almyra Free.    On fenofibrate. Trig are now much better. Has increased his ASA dose as well due to hx of CVA and MI in family. (162mg  bid), also on pepcid, omeprazole.  Maternal grandmother and paternal uncle.    L Inguinal hernia has not been repaired yet. Can feel popping in and out.  Does not feel it getting incarcerated. Not ready to do surgery yet.    Has been with current partner for 10 yrs.[name]). His mom has dementia (91). Dad has DM (93 this year), seen at [facility] and PCP. Has help from his brother.  Partner doing well, mom is about the same.     HIV 1 RNA Quant  Date Value  11/30/2021 <20 DETECTED copies/mL (A)  10/31/2020 Not Detected Copies/mL  12/07/2019 25 copies/mL (H)   HIV-1 RNA Viral Load (copies/mL)  Date Value  09/24/2022 <20   CD4 T Cell Abs (/uL)  Date Value  09/24/2022 1,146  10/31/2020 1,351  12/07/2019 1,004     Health Maintenance  Topic Date Due  . FOOT EXAM  Never done  . Diabetic kidney evaluation - Urine ACR  12/06/2020  . COVID-19 Vaccine (9 - 2023-24 season) 07/20/2022  . INFLUENZA VACCINE  03/20/2023  . HEMOGLOBIN A1C  03/25/2023  . OPHTHALMOLOGY EXAM  09/07/2023  .  Diabetic kidney evaluation - eGFR measurement  09/25/2023  . DTaP/Tdap/Td (4 - Td or Tdap) 10/27/2026  . COLONOSCOPY (Pts 45-67yrs Insurance coverage will need to be confirmed)  11/02/2031  . Hepatitis C Screening  Completed  . HIV Screening  Completed  . Zoster Vaccines- Shingrix  Completed  . HPV VACCINES  Aged Out      Review of Systems  Constitutional:  Negative for chills, fever and weight loss.  Eyes:  Negative for blurred vision.  Respiratory:  Negative for cough and shortness of breath.   Gastrointestinal:  Negative for constipation and diarrhea.  Genitourinary:  Negative for dysuria.  Neurological:  Negative for sensory change.  Psychiatric/Behavioral:  The patient does not have insomnia.     Please see HPI. All other systems reviewed and negative.     Objective:  Physical Exam Vitals reviewed.  Constitutional:      Appearance: Normal appearance. He is normal weight.  HENT:     Mouth/Throat:     Mouth: Mucous membranes are moist.     Pharynx: No oropharyngeal exudate.  Eyes:     Extraocular Movements: Extraocular movements intact.     Pupils: Pupils are equal, round, and reactive to light.  Cardiovascular:     Rate and Rhythm: Normal rate and  regular rhythm.     Pulses:          Dorsalis pedis pulses are 3+ on the right side and 3+ on the left side.  Pulmonary:     Effort: Pulmonary effort is normal.     Breath sounds: Normal breath sounds.  Abdominal:     General: Bowel sounds are normal. There is no distension.     Palpations: Abdomen is soft.     Tenderness: There is no abdominal tenderness.  Musculoskeletal:        General: Normal range of motion.     Right lower leg: No edema.     Left lower leg: No edema.     Right foot: Normal range of motion. No deformity or Charcot foot.     Left foot: Normal range of motion. No deformity or Charcot foot.  Feet:     Right foot:     Protective Sensation: 2 sites tested.  2 sites sensed.     Skin integrity: Skin  integrity normal.     Toenail Condition: Right toenails are normal.     Left foot:     Protective Sensation: 2 sites tested.  2 sites sensed.     Skin integrity: Skin integrity normal.     Toenail Condition: Left toenails are normal.  Neurological:     General: No focal deficit present.     Mental Status: He is alert.  Psychiatric:        Mood and Affect: Mood normal.        Behavior: Behavior normal.           Assessment & Plan:

## 2023-05-19 ENCOUNTER — Other Ambulatory Visit: Payer: Self-pay | Admitting: Infectious Diseases

## 2023-05-19 DIAGNOSIS — F419 Anxiety disorder, unspecified: Secondary | ICD-10-CM

## 2023-05-27 ENCOUNTER — Telehealth: Payer: Self-pay | Admitting: *Deleted

## 2023-05-27 ENCOUNTER — Encounter: Payer: Self-pay | Admitting: Infectious Diseases

## 2023-05-27 NOTE — Telephone Encounter (Signed)
Received a call from Rob, pharm at [facility] to verified a call-in rxs of Ambien and Ativan. Rob stated he needed to be sure It was Dr Ninetta Lights who had called; stated he did not recognized the telephone# (it was not an [phone] #). I called Dr Ninetta Lights who stated he did call in the rxs (and even gave the pharm his DEA#). I called [facility] back but  the pharmacy is closed for lunch.

## 2023-05-27 NOTE — Telephone Encounter (Signed)
I called [facility] pharmacy x 2 ( put on hold for 10 mins then again for 15 mins). Called again and I left message for Ralene Ok, that Dr Ninetta Lights had called in the rxs.

## 2023-06-25 ENCOUNTER — Other Ambulatory Visit (HOSPITAL_COMMUNITY)
Admission: RE | Admit: 2023-06-25 | Discharge: 2023-06-25 | Disposition: A | Discharge status: Home / Self Care | Payer: PRIVATE HEALTH INSURANCE | Source: Ambulatory Visit | Attending: Infectious Diseases | Admitting: Infectious Diseases

## 2023-06-25 ENCOUNTER — Ambulatory Visit: Payer: PRIVATE HEALTH INSURANCE | Admitting: Infectious Diseases

## 2023-06-25 ENCOUNTER — Encounter: Payer: Self-pay | Admitting: Infectious Diseases

## 2023-06-25 VITALS — BP 117/81 | HR 83 | Temp 97.8°F | Ht 71.0 in | Wt 167.1 lb

## 2023-06-25 DIAGNOSIS — A601 Herpesviral infection of perianal skin and rectum: Secondary | ICD-10-CM | POA: Diagnosis not present

## 2023-06-25 DIAGNOSIS — Z113 Encounter for screening for infections with a predominantly sexual mode of transmission: Secondary | ICD-10-CM | POA: Diagnosis present

## 2023-06-25 DIAGNOSIS — E781 Pure hyperglyceridemia: Secondary | ICD-10-CM

## 2023-06-25 DIAGNOSIS — K219 Gastro-esophageal reflux disease without esophagitis: Secondary | ICD-10-CM

## 2023-06-25 DIAGNOSIS — E1165 Type 2 diabetes mellitus with hyperglycemia: Secondary | ICD-10-CM | POA: Diagnosis not present

## 2023-06-25 DIAGNOSIS — Z7984 Long term (current) use of oral hypoglycemic drugs: Secondary | ICD-10-CM

## 2023-06-25 DIAGNOSIS — K6282 Dysplasia of anus: Secondary | ICD-10-CM

## 2023-06-25 DIAGNOSIS — Z794 Long term (current) use of insulin: Secondary | ICD-10-CM

## 2023-06-25 DIAGNOSIS — B20 Human immunodeficiency virus [HIV] disease: Secondary | ICD-10-CM

## 2023-06-25 DIAGNOSIS — E0865 Diabetes mellitus due to underlying condition with hyperglycemia: Secondary | ICD-10-CM

## 2023-06-25 DIAGNOSIS — N200 Calculus of kidney: Secondary | ICD-10-CM

## 2023-06-25 DIAGNOSIS — F419 Anxiety disorder, unspecified: Secondary | ICD-10-CM

## 2023-06-25 DIAGNOSIS — E881 Lipodystrophy, not elsewhere classified: Secondary | ICD-10-CM

## 2023-06-25 DIAGNOSIS — E119 Type 2 diabetes mellitus without complications: Secondary | ICD-10-CM | POA: Insufficient documentation

## 2023-06-25 MED ORDER — PANTOPRAZOLE SODIUM 40 MG PO TBEC: 40.0000 mg | DELAYED_RELEASE_TABLET | Freq: Every day | ORAL | 3 refills | Status: DC

## 2023-06-25 MED ORDER — VALACYCLOVIR HCL 1 G PO TABS
1000.0000 mg | ORAL_TABLET | Freq: Two times a day (BID) | ORAL | 3 refills | Status: AC | PRN
Start: 2023-06-25 — End: ?

## 2023-06-25 MED ORDER — INDAPAMIDE 2.5 MG PO TABS: 2.5000 mg | ORAL_TABLET | ORAL | 3 refills | Status: DC

## 2023-06-25 MED ORDER — METFORMIN HCL ER 500 MG PO TB24
1000.0000 mg | ORAL_TABLET | Freq: Two times a day (BID) | ORAL | 3 refills | Status: DC
Start: 2023-06-25 — End: 2024-03-16

## 2023-06-25 MED ORDER — LAMIVUDINE-ZIDOVUDINE 150-300 MG PO TABS: 1.0000 | ORAL_TABLET | Freq: Two times a day (BID) | ORAL | 3 refills | Status: DC

## 2023-06-25 MED ORDER — FAMOTIDINE 40 MG PO TABS: 40.0000 mg | ORAL_TABLET | Freq: Every day | ORAL | 3 refills | Status: DC

## 2023-06-25 MED ORDER — TRULICITY 0.75 MG/0.5ML ~~LOC~~ SOAJ: SUBCUTANEOUS | 3 refills | Status: DC

## 2023-06-25 MED ORDER — MOMETASONE FUROATE 0.1 % EX CREA: TOPICAL_CREAM | CUTANEOUS | 3 refills | Status: AC

## 2023-06-25 MED ORDER — FENOFIBRATE 145 MG PO TABS: 145.0000 mg | ORAL_TABLET | Freq: Every evening | ORAL | 3 refills | Status: DC

## 2023-06-25 MED ORDER — EFAVIRENZ 600 MG PO TABS: ORAL_TABLET | ORAL | 3 refills | Status: DC

## 2023-06-25 NOTE — Assessment & Plan Note (Signed)
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  He is in committed relationship Will also check his PSA.  Rtc 12-2023

## 2023-06-25 NOTE — Assessment & Plan Note (Signed)
Doing well on statin    Latest Ref Rng & Units 09/24/2022    2:12 PM 11/30/2021    2:04 PM 10/31/2020    4:16 PM  Hepatic Function  Total Protein 6.0 - 8.5 g/dL 7.3  7.9  7.5   Albumin 3.8 - 4.9 g/dL 4.6     AST 0 - 40 IU/L 65  56  60   ALT 0 - 44 IU/L 73  50  86   Alk Phosphatase 44 - 121 IU/L 44     Total Bilirubin 0.0 - 1.2 mg/dL 0.5  0.6  0.7     Lipid Panel     Component Value Date/Time   CHOL 164 09/24/2022 1412   TRIG 316 (H) 09/24/2022 1412   HDL 28 (L) 09/24/2022 1412   CHOLHDL 5.9 (H) 09/24/2022 1412   CHOLHDL 5.5 (H) 11/30/2021 1404   VLDL NOT CALC 03/06/2016 1018   LDLCALC 84 09/24/2022 1412   LDLCALC  11/30/2021 1404     Comment:     . LDL cholesterol not calculated. Triglyceride levels greater than 400 mg/dL invalidate calculated LDL results. . Reference range: <100 . Desirable range <100 mg/dL for primary prevention;   <70 mg/dL for patients with CHD or diabetic patients  with > or = 2 CHD risk factors. Marland Kitchen LDL-C is now calculated using the Martin-Hopkins  calculation, which is a validated novel method providing  better accuracy than the Friedewald equation in the  estimation of LDL-C.  Horald Pollen et al. Lenox Ahr. 8295;621(30): 2061-2068  (http://education.[facility].com/faq/FAQ164)    LABVLDL 52 (H) 09/24/2022 1412

## 2023-06-25 NOTE — Assessment & Plan Note (Signed)
VTX prn, refilled.

## 2023-06-25 NOTE — Assessment & Plan Note (Signed)
He is doing well on metformin and trulicity.  Will continue to monitor along with his PCP.

## 2023-06-25 NOTE — Progress Notes (Addendum)
Subjective:    Patient ID: Brendan Price, male  DOB: Oct 21, 1970, 52 y.o.        MRN: [id number]   HPI 52 yo M diagnosed with HIV in 2002.  He remains on his initial ART of CBV/EFV Prev had nephrolithiasis. Had laser of stone, basket extraction and stent placement on 12-18-21.    Has been dx as DM2 (07-2019)- on metformin. Initial A1C 5.2% (11-2021). Monitors at home, not regularly. Thinks his sugars have been "ok". Is going to get new home monitor. Still on trulicity and metformin.  Eye exam 09-04-21. Over 1 month had inflamed capillaries on his L sclera.No change in his vision (minimal per his ophtho). Vision has been good.    Had colon 09-2021 found to have multiple polyps (tubular adenomas on path) as well as AIN1.  Repeat set for 2033, we discussed 5 yr instead. Dr Almyra Free.    On fenofibrate. Trig are now much better. Has increased his ASA dose as well due to hx of CVA and MI in family. (162mg  bid), also on pepcid, omeprazole.  Maternal grandmother and paternal uncle.    L Inguinal hernia has not been repaired yet. Not ready to do surgery yet.    Has been with current partner for 9 yrs (April 2015).Brendan Price). His mom has dementia (91). Dad has DM (93 this year), seen at [facility] and PCP. Has help from his brother.  Had basal cell ca removed from his face today as well as mole in his arm pit.  Wants to reset his meds.    HIV 1 RNA Quant  Date Value  11/30/2021 <20 DETECTED copies/mL (A)  10/31/2020 Not Detected Copies/mL  12/07/2019 25 copies/mL (H)   HIV-1 RNA Viral Load (copies/mL)  Date Value  09/24/2022 <20   CD4 T Cell Abs (/uL)  Date Value  09/24/2022 1,146  10/31/2020 1,351  12/07/2019 1,004     Health Maintenance  Topic Date Due   Diabetic kidney evaluation - Urine ACR  12/06/2020   HEMOGLOBIN A1C  03/25/2023   COVID-19 Vaccine (9 - 2023-24 season) 07/12/2023   OPHTHALMOLOGY EXAM  09/07/2023   Diabetic kidney evaluation - eGFR measurement  09/25/2023   FOOT EXAM   01/07/2024   DTaP/Tdap/Td (4 - Td or Tdap) 10/27/2026   Colonoscopy  11/02/2031   INFLUENZA VACCINE  Completed   Hepatitis C Screening  Completed   HIV Screening  Completed   Zoster Vaccines- Shingrix  Completed   HPV VACCINES  Aged Out      Review of Systems  Constitutional:  Negative for weight loss.  Genitourinary:  Negative for dysuria, frequency, hematuria and urgency.   Please see HPI. All other systems reviewed and negative.     Objective:  Physical Exam Vitals reviewed.  Constitutional:      General: He is not in acute distress.    Appearance: Normal appearance. He is normal weight. He is not toxic-appearing.  HENT:     Mouth/Throat:     Mouth: Mucous membranes are moist.     Pharynx: No oropharyngeal exudate.  Eyes:     Extraocular Movements: Extraocular movements intact.     Pupils: Pupils are equal, round, and reactive to light.  Cardiovascular:     Rate and Rhythm: Normal rate and regular rhythm.  Pulmonary:     Effort: Pulmonary effort is normal.     Breath sounds: Normal breath sounds.  Abdominal:     General: Bowel sounds are normal. There is no  distension.     Palpations: Abdomen is soft.     Tenderness: There is no abdominal tenderness.  Musculoskeletal:        General: Normal range of motion.     Cervical back: Normal range of motion and neck supple.     Right lower leg: No edema.     Left lower leg: No edema.       Legs:     Comments: Small scab, well healing. No d/c.   Feet:     Right foot:     Protective Sensation:  1 site tested.  1 site sensed.    Skin integrity: Callus present.     Toenail Condition: Right toenails are normal.     Left foot:     Protective Sensation:  1 site tested.  1 site sensed.    Skin integrity: Callus present.     Toenail Condition: Left toenails are normal.  Neurological:     General: No focal deficit present.     Mental Status: He is alert.  Psychiatric:        Mood and Affect: Mood normal.           Assessment & Plan:

## 2023-06-25 NOTE — Assessment & Plan Note (Signed)
Slightly better with trulicity

## 2023-06-25 NOTE — Assessment & Plan Note (Signed)
His xanax and Remus Loffler are refilled Reviewed his PMP, I am his only prescriber.

## 2023-06-25 NOTE — Assessment & Plan Note (Signed)
He is awaiting his repeat screen from GI.

## 2023-06-26 ENCOUNTER — Telehealth: Payer: Self-pay | Admitting: *Deleted

## 2023-06-26 LAB — CBC
Hematocrit: 38.2 % (ref 37.5–51.0)
Hemoglobin: 14.2 g/dL (ref 13.0–17.7)
MCH: 44.5 pg — ABNORMAL HIGH (ref 26.6–33.0)
MCHC: 37.2 g/dL — ABNORMAL HIGH (ref 31.5–35.7)
MCV: 120 fL — ABNORMAL HIGH (ref 79–97)
Platelets: 291 10*3/uL (ref 150–450)
RBC: 3.19 x10E6/uL — ABNORMAL LOW (ref 4.14–5.80)
RDW: 12.9 % (ref 11.6–15.4)
WBC: 5 10*3/uL (ref 3.4–10.8)

## 2023-06-26 LAB — RPR: RPR Ser Ql: NONREACTIVE

## 2023-06-26 LAB — COMPREHENSIVE METABOLIC PANEL
ALT: 90 [IU]/L — ABNORMAL HIGH (ref 0–44)
AST: 88 [IU]/L — ABNORMAL HIGH (ref 0–40)
Albumin: 4.7 g/dL (ref 3.8–4.9)
Alkaline Phosphatase: 55 [IU]/L (ref 44–121)
BUN/Creatinine Ratio: 18 (ref 9–20)
BUN: 18 mg/dL (ref 6–24)
Bilirubin Total: 0.5 mg/dL (ref 0.0–1.2)
CO2: 24 mmol/L (ref 20–29)
Calcium: 10.2 mg/dL (ref 8.7–10.2)
Chloride: 99 mmol/L (ref 96–106)
Creatinine, Ser: 0.99 mg/dL (ref 0.76–1.27)
Globulin, Total: 2.8 g/dL (ref 1.5–4.5)
Glucose: 148 mg/dL — ABNORMAL HIGH (ref 70–99)
Potassium: 3.6 mmol/L (ref 3.5–5.2)
Sodium: 141 mmol/L (ref 134–144)
Total Protein: 7.5 g/dL (ref 6.0–8.5)
eGFR: 92 mL/min/{1.73_m2} (ref >59)

## 2023-06-26 LAB — LIPID PANEL
Chol/HDL Ratio: 6.8 ratio — ABNORMAL HIGH (ref 0.0–5.0)
Cholesterol, Total: 177 mg/dL (ref 100–199)
HDL: 26 mg/dL — ABNORMAL LOW (ref >39)
LDL Chol Calc (NIH): 55 mg/dL (ref 0–99)
Triglycerides: 650 mg/dL (ref 0–149)
VLDL Cholesterol Cal: 96 mg/dL — ABNORMAL HIGH (ref 5–40)

## 2023-06-26 LAB — HIV-1 RNA QUANT-NO REFLEX-BLD
HIV-1 RNA Viral Load Log: 2 {Log}
HIV-1 RNA Viral Load: 100 {copies}/mL

## 2023-06-26 LAB — PSA: Prostate Specific Ag, Serum: 1.1 ng/mL (ref 0.0–4.0)

## 2023-06-26 LAB — URINE CYTOLOGY ANCILLARY ONLY
Chlamydia: NEGATIVE
Comment: NEGATIVE
Comment: NORMAL
Neisseria Gonorrhea: NEGATIVE

## 2023-06-26 LAB — T-HELPER CELLS (CD4) COUNT (NOT AT ARMC)
CD4 % Helper T Cell: 42 % (ref 33–65)
CD4 T Cell Abs: 986 /uL (ref 400–1790)

## 2023-06-26 NOTE — Telephone Encounter (Signed)
Message to Dr. Ninetta Lights to review abn. Lab results on patient.

## 2023-11-23 ENCOUNTER — Other Ambulatory Visit: Payer: Self-pay | Admitting: Infectious Diseases

## 2023-11-23 DIAGNOSIS — A09 Infectious gastroenteritis and colitis, unspecified: Secondary | ICD-10-CM

## 2023-11-23 MED ORDER — AZITHROMYCIN 250 MG PO TABS: ORAL_TABLET | ORAL | 0 refills | Status: AC

## 2023-12-29 ENCOUNTER — Other Ambulatory Visit: Payer: Self-pay | Admitting: Infectious Diseases

## 2023-12-29 DIAGNOSIS — F419 Anxiety disorder, unspecified: Secondary | ICD-10-CM

## 2023-12-29 MED ORDER — ZOLPIDEM TARTRATE 10 MG PO TABS: 10.0000 mg | ORAL_TABLET | Freq: Every day | ORAL | 1 refills | Status: DC

## 2023-12-29 MED ORDER — LORAZEPAM 1 MG PO TABS: 1.0000 mg | ORAL_TABLET | Freq: Two times a day (BID) | ORAL | 1 refills | Status: DC | PRN

## 2024-03-11 ENCOUNTER — Other Ambulatory Visit (HOSPITAL_COMMUNITY)
Admission: RE | Admit: 2024-03-11 | Discharge: 2024-03-11 | Disposition: A | Discharge status: Home / Self Care | Payer: PRIVATE HEALTH INSURANCE | Source: Ambulatory Visit | Attending: Internal Medicine | Admitting: Internal Medicine

## 2024-03-11 ENCOUNTER — Other Ambulatory Visit: Payer: PRIVATE HEALTH INSURANCE

## 2024-03-11 DIAGNOSIS — E781 Pure hyperglyceridemia: Secondary | ICD-10-CM

## 2024-03-11 DIAGNOSIS — Z113 Encounter for screening for infections with a predominantly sexual mode of transmission: Secondary | ICD-10-CM | POA: Diagnosis present

## 2024-03-11 DIAGNOSIS — B20 Human immunodeficiency virus [HIV] disease: Secondary | ICD-10-CM

## 2024-03-12 ENCOUNTER — Telehealth: Payer: Self-pay | Admitting: *Deleted

## 2024-03-12 LAB — CBC
Hematocrit: 37 % — ABNORMAL LOW (ref 37.5–51.0)
Hemoglobin: 13.4 g/dL (ref 13.0–17.7)
MCH: 43.5 pg — ABNORMAL HIGH (ref 26.6–33.0)
MCHC: 36.2 g/dL — ABNORMAL HIGH (ref 31.5–35.7)
MCV: 120 fL — ABNORMAL HIGH (ref 79–97)
Platelets: 312 x10E3/uL (ref 150–450)
RBC: 3.08 x10E6/uL — ABNORMAL LOW (ref 4.14–5.80)
RDW: 13.1 % (ref 11.6–15.4)
WBC: 5.7 x10E3/uL (ref 3.4–10.8)

## 2024-03-12 LAB — URINE CYTOLOGY ANCILLARY ONLY
Chlamydia: NEGATIVE
Comment: NEGATIVE
Comment: NORMAL
Neisseria Gonorrhea: NEGATIVE

## 2024-03-12 LAB — LIPID PANEL
Chol/HDL Ratio: 7.3 ratio — ABNORMAL HIGH (ref 0.0–5.0)
Cholesterol, Total: 174 mg/dL (ref 100–199)
HDL: 24 mg/dL — ABNORMAL LOW (ref >39)
LDL Chol Calc (NIH): 52 mg/dL (ref 0–99)
Triglycerides: 671 mg/dL (ref 0–149)
VLDL Cholesterol Cal: 98 mg/dL — ABNORMAL HIGH (ref 5–40)

## 2024-03-12 LAB — COMPREHENSIVE METABOLIC PANEL WITH GFR
ALT: 75 IU/L — ABNORMAL HIGH (ref 0–44)
AST: 73 IU/L — ABNORMAL HIGH (ref 0–40)
Albumin: 4.9 g/dL (ref 3.8–4.9)
Alkaline Phosphatase: 47 IU/L (ref 44–121)
BUN/Creatinine Ratio: 22 — ABNORMAL HIGH (ref 9–20)
BUN: 21 mg/dL (ref 6–24)
Bilirubin Total: 0.5 mg/dL (ref 0.0–1.2)
CO2: 20 mmol/L (ref 20–29)
Calcium: 10.1 mg/dL (ref 8.7–10.2)
Chloride: 100 mmol/L (ref 96–106)
Creatinine, Ser: 0.97 mg/dL (ref 0.76–1.27)
Globulin, Total: 2.7 g/dL (ref 1.5–4.5)
Glucose: 172 mg/dL — ABNORMAL HIGH (ref 70–99)
Potassium: 3.5 mmol/L (ref 3.5–5.2)
Sodium: 139 mmol/L (ref 134–144)
Total Protein: 7.6 g/dL (ref 6.0–8.5)
eGFR: 93 mL/min/1.73 (ref >59)

## 2024-03-12 LAB — T-HELPER CELL (CD4) - (RCID CLINIC ONLY)
CD4 % Helper T Cell: 40 % (ref 33–65)
CD4 T Cell Abs: 1017 /uL (ref 400–1790)

## 2024-03-12 LAB — HIV-1 RNA QUANT-NO REFLEX-BLD: HIV-1 RNA Viral Load: <20 {copies}/mL

## 2024-03-12 LAB — RPR: RPR Ser Ql: NONREACTIVE

## 2024-03-16 ENCOUNTER — Ambulatory Visit: Payer: PRIVATE HEALTH INSURANCE | Admitting: Infectious Diseases

## 2024-03-16 ENCOUNTER — Encounter: Payer: PRIVATE HEALTH INSURANCE | Admitting: Infectious Diseases

## 2024-03-16 VITALS — BP 125/91 | HR 101 | Temp 98.3°F | Ht 71.0 in | Wt 165.8 lb

## 2024-03-16 DIAGNOSIS — C801 Malignant (primary) neoplasm, unspecified: Secondary | ICD-10-CM | POA: Diagnosis not present

## 2024-03-16 DIAGNOSIS — K6282 Dysplasia of anus: Secondary | ICD-10-CM | POA: Diagnosis not present

## 2024-03-16 DIAGNOSIS — B20 Human immunodeficiency virus [HIV] disease: Secondary | ICD-10-CM

## 2024-03-16 DIAGNOSIS — Z113 Encounter for screening for infections with a predominantly sexual mode of transmission: Secondary | ICD-10-CM

## 2024-03-16 DIAGNOSIS — N200 Calculus of kidney: Secondary | ICD-10-CM

## 2024-03-16 DIAGNOSIS — E1165 Type 2 diabetes mellitus with hyperglycemia: Secondary | ICD-10-CM | POA: Diagnosis not present

## 2024-03-16 DIAGNOSIS — E0865 Diabetes mellitus due to underlying condition with hyperglycemia: Secondary | ICD-10-CM

## 2024-03-16 DIAGNOSIS — K219 Gastro-esophageal reflux disease without esophagitis: Secondary | ICD-10-CM

## 2024-03-16 DIAGNOSIS — Z7985 Long-term (current) use of injectable non-insulin antidiabetic drugs: Secondary | ICD-10-CM

## 2024-03-16 DIAGNOSIS — E781 Pure hyperglyceridemia: Secondary | ICD-10-CM

## 2024-03-16 DIAGNOSIS — Z7984 Long term (current) use of oral hypoglycemic drugs: Secondary | ICD-10-CM

## 2024-03-16 MED ORDER — METFORMIN HCL ER 500 MG PO TB24: 1000.0000 mg | ORAL_TABLET | Freq: Two times a day (BID) | ORAL | 3 refills | Status: AC

## 2024-03-16 MED ORDER — EFAVIRENZ 600 MG PO TABS: ORAL_TABLET | ORAL | 3 refills | Status: AC

## 2024-03-16 MED ORDER — TRULICITY 0.75 MG/0.5ML ~~LOC~~ SOAJ: SUBCUTANEOUS | 3 refills | Status: AC

## 2024-03-16 MED ORDER — ROSUVASTATIN CALCIUM 5 MG PO TABS: 5.0000 mg | ORAL_TABLET | Freq: Every day | ORAL | 3 refills | Status: AC

## 2024-03-16 MED ORDER — PANTOPRAZOLE SODIUM 40 MG PO TBEC
40.0000 mg | DELAYED_RELEASE_TABLET | Freq: Every day | ORAL | 3 refills | Status: AC
Start: 2024-03-16 — End: ?

## 2024-03-16 MED ORDER — INDAPAMIDE 2.5 MG PO TABS: 2.5000 mg | ORAL_TABLET | ORAL | 3 refills | Status: AC

## 2024-03-16 MED ORDER — LAMIVUDINE-ZIDOVUDINE 150-300 MG PO TABS: 1.0000 | ORAL_TABLET | Freq: Two times a day (BID) | ORAL | 3 refills | Status: AC

## 2024-03-16 MED ORDER — FAMOTIDINE 40 MG PO TABS: 40.0000 mg | ORAL_TABLET | Freq: Every day | ORAL | 3 refills | Status: AC

## 2024-03-16 MED ORDER — FENOFIBRATE 145 MG PO TABS: 145.0000 mg | ORAL_TABLET | Freq: Every evening | ORAL | 3 refills | Status: AC

## 2024-03-16 NOTE — Progress Notes (Signed)
 Subjective:    Patient ID: Brendan Price, male  DOB: 22-Jul-1971, 53 y.o.        MRN: [id number]   HPI Expand All Collapse All    Subjective:   53 yo M diagnosed with HIV in 04/20/2001.  He remains on his initial ART of CBV/EFV Prev had nephrolithiasis. Had laser of stone, basket extraction and stent placement on 12-18-21.    Has been dx as DM2 (07-2019)- on metformin . Initial A1C 5.5% 04-21-2023). Monitors at home, not regularly. Thinks his sugars have been ok. Is going to get new home monitor. Still on trulicity  and metformin . 172 at last blood draw (non-fasting). He is considering getting a OTC glc monitor. Has been eating more eggs.  Eye exam 2024/04/20.Having dry eyes recently (using biotru). Vision has been good.    Had colon 09-2021 found to have multiple polyps (tubular adenomas on path) as well as AIN1.  Repeat set for 2032/04/20, we discussed 5 yr instead 2027-04-21). Dr Jerris.    On fenofibrate . Trig are now much better. Has increased his ASA dose as well due to hx of CVA and MI in family. (162mg  bid), also on pepcid , omeprazole .  Maternal grandmother and paternal uncle.  Has occas cramps in his LE at night- he's not sure if it is fenofibrate . No persistent muscle aches. Occas takes glass of OJ at night to help relieve.    L Inguinal hernia has not been repaired yet. Can feel popping in and out.  Does not feel it getting incarcerated. Not ready to do surgery yet.   He had skin Basal Cell Ca removed from his L cheek 08-2023.    Has been with current partner for 11 yrs.Thermon). His mom has dementia, DM (92 on May 18, 2024). Dad passed away 04-21-2023. Has help from his brother.   Partner doing well. Marolyn is still working.    HIV 1 RNA Quant  Date Value  11/30/2021 <20 DETECTED copies/mL (A)  10/31/2020 Not Detected Copies/mL  12/07/2019 25 copies/mL (H)   HIV-1 RNA Viral Load (copies/mL)  Date Value  03/11/2024 <20  06/25/2023 100  09/24/2022 <20   CD4 T Cell Abs (/uL)  Date Value   03/11/2024 1,017  06/25/2023 986  09/24/2022 1,146   Lab Results  Component Value Date   CHOL 174 03/11/2024   HDL 24 (L) 03/11/2024   LDLCALC 52 03/11/2024   TRIG 671 (HH) 03/11/2024   CHOLHDL 7.3 (H) 03/11/2024      Health Maintenance  Topic Date Due  . Hepatitis B Vaccines (2 of 3 - 19+ 3-dose series) 09/17/1995  . Pneumococcal Vaccine 8-51 Years old (4 of 4 - PCV20 or PCV21) 08/20/2020  . Diabetic kidney evaluation - Urine ACR  12/06/2020  . HEMOGLOBIN A1C  03/25/2023  . OPHTHALMOLOGY EXAM  09/07/2023  . COVID-19 Vaccine (9 - Pfizer risk 2024-25 season) 11/14/2023  . FOOT EXAM  01/07/2024  . INFLUENZA VACCINE  03/19/2024  . Diabetic kidney evaluation - eGFR measurement  03/11/2025  . DTaP/Tdap/Td (4 - Td or Tdap) 10/27/2026  . Colonoscopy  11/02/2031  . Hepatitis C Screening  Completed  . HIV Screening  Completed  . Zoster Vaccines- Shingrix  Completed  . HPV VACCINES  Aged Out  . Meningococcal B Vaccine  Aged Out    Review of Systems  Constitutional:  Negative for malaise/fatigue.  Gastrointestinal:  Negative for constipation and diarrhea.  Genitourinary:  Negative for dysuria.  Neurological:  Negative for sensory change.  Please see HPI. All other systems reviewed and negative.     Objective:  Physical Exam Vitals reviewed.  Constitutional:      General: He is not in acute distress.    Appearance: Normal appearance. He is not toxic-appearing.  HENT:     Mouth/Throat:     Mouth: Mucous membranes are moist.     Pharynx: No oropharyngeal exudate.  Eyes:     Pupils: Pupils are equal, round, and reactive to light.  Cardiovascular:     Rate and Rhythm: Normal rate and regular rhythm.     Pulses:          Dorsalis pedis pulses are 3+ on the right side and 3+ on the left side.     Heart sounds: Normal heart sounds.  Pulmonary:     Effort: Pulmonary effort is normal.     Breath sounds: Normal breath sounds.  Abdominal:     General: Bowel sounds are  normal.     Palpations: Abdomen is soft.  Musculoskeletal:        General: Normal range of motion.     Cervical back: Normal range of motion and neck supple.     Right lower leg: No edema.     Left lower leg: No edema.       Feet:  Feet:     Right foot:     Protective Sensation: 2 sites tested.  2 sites sensed.     Skin integrity: Callus present.     Toenail Condition: Right toenails are normal.     Left foot:     Protective Sensation: 2 sites tested.  2 sites sensed.     Skin integrity: Callus present.     Toenail Condition: Left toenails are normal.  Neurological:     General: No focal deficit present.     Mental Status: He is alert.           Assessment & Plan:

## 2024-03-16 NOTE — Assessment & Plan Note (Signed)
 Has had f/u with GI Repeat endoscopy 2028

## 2024-03-16 NOTE — Assessment & Plan Note (Signed)
 Will add low dose of crestor  to see if we can better control.  Will repeat CMP and lipids in 3 months.

## 2024-03-16 NOTE — Assessment & Plan Note (Addendum)
 Doing very well Considerations for updating art- biktarvy or dovato? Added statin In committed relationship. Will see back in 9 months Will get labs prior Covid and fluvax when available in fall.

## 2024-03-16 NOTE — Assessment & Plan Note (Signed)
 Will repeat his A1C at his f/u lab next month.  Appears to have good control.  Started statin

## 2024-03-17 ENCOUNTER — Encounter: Payer: PRIVATE HEALTH INSURANCE | Admitting: Infectious Diseases

## 2024-06-28 ENCOUNTER — Other Ambulatory Visit: Payer: Self-pay | Admitting: Infectious Diseases

## 2024-06-28 DIAGNOSIS — F419 Anxiety disorder, unspecified: Secondary | ICD-10-CM

## 2024-06-28 MED ORDER — LORAZEPAM 1 MG PO TABS: 1.0000 mg | ORAL_TABLET | Freq: Two times a day (BID) | ORAL | 1 refills | Status: AC | PRN

## 2024-06-28 MED ORDER — ZOLPIDEM TARTRATE 10 MG PO TABS: 10.0000 mg | ORAL_TABLET | Freq: Every day | ORAL | 1 refills | Status: AC

## 2024-10-14 ENCOUNTER — Other Ambulatory Visit: Payer: Self-pay

## 2024-11-16 ENCOUNTER — Encounter: Payer: Self-pay | Admitting: Infectious Diseases
